# Patient Record
Sex: Female | Born: 1970 | Race: White | Hispanic: No | Marital: Married | Smoking: Never smoker
Health system: Southern US, Community
[De-identification: ages and names within clinical notes are randomized; demographics above are authoritative.]

## PROBLEM LIST (undated history)

## (undated) DIAGNOSIS — Z8489 Family history of other specified conditions: Secondary | ICD-10-CM

## (undated) DIAGNOSIS — Z8719 Personal history of other diseases of the digestive system: Secondary | ICD-10-CM

## (undated) DIAGNOSIS — M359 Systemic involvement of connective tissue, unspecified: Secondary | ICD-10-CM

## (undated) DIAGNOSIS — K5190 Ulcerative colitis, unspecified, without complications: Secondary | ICD-10-CM

## (undated) DIAGNOSIS — D649 Anemia, unspecified: Secondary | ICD-10-CM

## (undated) DIAGNOSIS — F419 Anxiety disorder, unspecified: Secondary | ICD-10-CM

## (undated) HISTORY — PX: OTHER SURGICAL HISTORY: SHX169

## (undated) HISTORY — PX: WISDOM TOOTH EXTRACTION: SHX21

## (undated) HISTORY — PX: COLONOSCOPY W/ POLYPECTOMY: SHX1380

---

## 2000-06-14 ENCOUNTER — Other Ambulatory Visit: Admission: RE | Admit: 2000-06-14 | Discharge: 2000-06-14 | Payer: Self-pay | Admitting: Obstetrics and Gynecology

## 2000-08-23 ENCOUNTER — Ambulatory Visit (HOSPITAL_COMMUNITY): Admission: RE | Admit: 2000-08-23 | Discharge: 2000-08-23 | Payer: Self-pay | Admitting: Obstetrics and Gynecology

## 2000-08-23 ENCOUNTER — Encounter: Payer: Self-pay | Admitting: Obstetrics and Gynecology

## 2000-09-07 ENCOUNTER — Inpatient Hospital Stay (HOSPITAL_COMMUNITY): Admission: AD | Admit: 2000-09-07 | Discharge: 2000-09-09 | Payer: Self-pay | Admitting: Obstetrics and Gynecology

## 2001-06-30 ENCOUNTER — Encounter: Payer: Self-pay | Admitting: Emergency Medicine

## 2001-06-30 ENCOUNTER — Emergency Department (HOSPITAL_COMMUNITY): Admission: EM | Admit: 2001-06-30 | Discharge: 2001-06-30 | Payer: Self-pay | Admitting: Emergency Medicine

## 2002-09-06 ENCOUNTER — Other Ambulatory Visit: Admission: RE | Admit: 2002-09-06 | Discharge: 2002-09-06 | Payer: Self-pay | Admitting: Obstetrics and Gynecology

## 2002-09-28 ENCOUNTER — Inpatient Hospital Stay (HOSPITAL_COMMUNITY): Admission: AD | Admit: 2002-09-28 | Discharge: 2002-09-28 | Payer: Self-pay | Admitting: Obstetrics and Gynecology

## 2002-11-14 ENCOUNTER — Inpatient Hospital Stay (HOSPITAL_COMMUNITY): Admission: AD | Admit: 2002-11-14 | Discharge: 2002-11-17 | Payer: Self-pay | Admitting: Obstetrics and Gynecology

## 2010-03-17 ENCOUNTER — Other Ambulatory Visit: Payer: Self-pay | Admitting: Family Medicine

## 2010-03-17 DIAGNOSIS — N6320 Unspecified lump in the left breast, unspecified quadrant: Secondary | ICD-10-CM

## 2010-03-19 ENCOUNTER — Ambulatory Visit
Admission: RE | Admit: 2010-03-19 | Discharge: 2010-03-19 | Disposition: A | Discharge status: Home / Self Care | Payer: Medicare HMO | Source: Ambulatory Visit | Attending: Family Medicine | Admitting: Family Medicine

## 2010-03-19 DIAGNOSIS — N6320 Unspecified lump in the left breast, unspecified quadrant: Secondary | ICD-10-CM

## 2011-09-22 ENCOUNTER — Ambulatory Visit (INDEPENDENT_AMBULATORY_CARE_PROVIDER_SITE_OTHER): Payer: Self-pay | Admitting: Obstetrics and Gynecology

## 2011-09-22 ENCOUNTER — Encounter: Payer: Self-pay | Admitting: Obstetrics and Gynecology

## 2011-09-22 VITALS — BP 110/62 | Resp 14 | Wt 132.0 lb

## 2011-09-22 DIAGNOSIS — N898 Other specified noninflammatory disorders of vagina: Secondary | ICD-10-CM

## 2011-09-22 NOTE — Progress Notes (Signed)
Presents with c/o of LMP 8/25 "took out tampon on 8/31 and discharge looked shredded like paper and like only 1/2 of the tampon came out, feeling crampy and bloated for a few days, douched x 4" Denies fever, chills, odor, or vag irritation, Declined STD testing O abd soft, nt, EGBUS WNL cervix pink mod white discharge no odor, No CMT Wet prep neg clue, neg trich, neg hyphae A no foreign object P discussed s/s fever, pain, irritation, and odor to report, f/o annually prn, collaboration with Dr. Su Hilt at [facility]. Lavera Guise, CNM

## 2011-09-23 DIAGNOSIS — N898 Other specified noninflammatory disorders of vagina: Secondary | ICD-10-CM | POA: Insufficient documentation

## 2013-11-13 ENCOUNTER — Encounter: Payer: Self-pay | Admitting: Obstetrics and Gynecology

## 2014-11-15 ENCOUNTER — Other Ambulatory Visit: Payer: Self-pay

## 2014-11-19 ENCOUNTER — Other Ambulatory Visit: Payer: Self-pay

## 2014-11-19 DIAGNOSIS — Z1231 Encounter for screening mammogram for malignant neoplasm of breast: Secondary | ICD-10-CM

## 2014-12-10 ENCOUNTER — Ambulatory Visit: Payer: Medicare HMO

## 2014-12-19 ENCOUNTER — Ambulatory Visit: Payer: Medicare HMO

## 2018-03-01 DIAGNOSIS — F419 Anxiety disorder, unspecified: Secondary | ICD-10-CM | POA: Insufficient documentation

## 2018-11-30 ENCOUNTER — Other Ambulatory Visit: Payer: Self-pay

## 2018-11-30 DIAGNOSIS — Z20822 Contact with and (suspected) exposure to covid-19: Secondary | ICD-10-CM

## 2018-12-02 LAB — NOVEL CORONAVIRUS, NAA: SARS-CoV-2, NAA: NOT DETECTED

## 2019-01-09 ENCOUNTER — Ambulatory Visit: Payer: HRSA Program | Attending: Internal Medicine

## 2019-01-09 DIAGNOSIS — Z20828 Contact with and (suspected) exposure to other viral communicable diseases: Secondary | ICD-10-CM | POA: Insufficient documentation

## 2019-01-09 DIAGNOSIS — Z20822 Contact with and (suspected) exposure to covid-19: Secondary | ICD-10-CM

## 2019-01-11 LAB — NOVEL CORONAVIRUS, NAA: SARS-CoV-2, NAA: NOT DETECTED

## 2019-01-30 ENCOUNTER — Encounter: Payer: Self-pay | Admitting: Family Medicine

## 2019-01-30 ENCOUNTER — Other Ambulatory Visit: Payer: Self-pay

## 2019-01-30 ENCOUNTER — Ambulatory Visit (INDEPENDENT_AMBULATORY_CARE_PROVIDER_SITE_OTHER): Payer: Self-pay | Admitting: Family Medicine

## 2019-01-30 DIAGNOSIS — W5503XA Scratched by cat, initial encounter: Secondary | ICD-10-CM

## 2019-01-30 DIAGNOSIS — S50811A Abrasion of right forearm, initial encounter: Secondary | ICD-10-CM

## 2019-01-30 MED ORDER — SULFAMETHOXAZOLE-TRIMETHOPRIM 800-160 MG PO TABS: 1.0000 | ORAL_TABLET | Freq: Two times a day (BID) | ORAL | 0 refills | Status: DC

## 2019-01-30 MED ORDER — AZITHROMYCIN 250 MG PO TABS: ORAL_TABLET | ORAL | 0 refills | Status: DC

## 2019-01-30 MED ORDER — TRAMADOL HCL 50 MG PO TABS: 50.0000 mg | ORAL_TABLET | Freq: Four times a day (QID) | ORAL | 0 refills | Status: DC | PRN

## 2019-01-30 NOTE — Progress Notes (Signed)
   Office Visit Note   Patient: Carly Armstrong           Date of Birth: 05-17-1970           MRN: [id number] Visit Date: 01/30/2019 Requested by: No referring provider defined for this encounter. PCP: System, Pcp Not In  Subjective: Chief Complaint  Patient presents with  . Right Forearm - Pain    DOI 01/28/19 Cat scratch/bite to forearm (own cat) - breaking up fight between her cat and her sister's dog. Redness/swelling in the forearm  - difficulty moving fingers. Right-hand dominant.    HPI: She is here with right forearm pain.  She is right-hand dominant.  2 days ago she was breaking up a fight between a cat and a dog, she stuck her arm between them and the cat scratched her in several spots.  Yesterday her arm was very swollen and painful, she had some amoxicillin at home and took a dose.  This morning it was even more swollen so she took some Cipro.  This afternoon it is slightly better but still very painful and swollen.  It hurts especially to move her third finger.  Denies any fevers or chills.  She has not noticed any swollen lymph nodes.  She is otherwise in good health.               ROS:   All other systems were reviewed and are negative.  Objective: Vital Signs: There were no vitals taken for this visit.  Physical Exam:  General:  Alert and oriented, in no acute distress. Pulm:  Breathing unlabored. Psy:  Normal mood, congruent affect. Skin: There are several puncture marks on her forearm but no active bleeding.  There is erythema and swelling on the dorsum of her forearm.  A photo was taken and placed in her chart.  She has very limited extension of her second, third, and fourth fingers due to pain in the mid forearm.  No palpable lymphadenopathy at the elbow.    Imaging: None today  Assessment & Plan: 1.  2 days status post right forearm cat scratch with probable bacterial infection - We discussed the risk of spread of infection and possible need for surgery. -We  will treat with Zithromax as well as Bactrim. - Will get her in with one of the surgeons if worsens.     Procedures: No procedures performed  No notes on file     PMFS History: Patient Active Problem List   Diagnosis Date Noted  . Anxiety 03/01/2018   History reviewed. No pertinent past medical history.  History reviewed. No pertinent family history.  History reviewed. No pertinent surgical history. Social History   Occupational History  . Not on file  Tobacco Use  . Smoking status: Not on file  Substance and Sexual Activity  . Alcohol use: Not on file  . Drug use: Not on file  . Sexual activity: Not on file

## 2019-02-10 ENCOUNTER — Encounter: Payer: Self-pay | Admitting: Family Medicine

## 2019-02-10 ENCOUNTER — Ambulatory Visit (INDEPENDENT_AMBULATORY_CARE_PROVIDER_SITE_OTHER): Payer: Self-pay | Admitting: Family Medicine

## 2019-02-10 ENCOUNTER — Ambulatory Visit: Payer: Self-pay

## 2019-02-10 ENCOUNTER — Other Ambulatory Visit: Payer: Self-pay

## 2019-02-10 DIAGNOSIS — W5503XA Scratched by cat, initial encounter: Secondary | ICD-10-CM

## 2019-02-10 DIAGNOSIS — S50811A Abrasion of right forearm, initial encounter: Secondary | ICD-10-CM

## 2019-02-10 MED ORDER — SULFAMETHOXAZOLE-TRIMETHOPRIM 800-160 MG PO TABS: 1.0000 | ORAL_TABLET | Freq: Two times a day (BID) | ORAL | 0 refills | Status: DC

## 2019-02-10 MED ORDER — AZITHROMYCIN 250 MG PO TABS: ORAL_TABLET | ORAL | 0 refills | Status: DC

## 2019-02-10 NOTE — Progress Notes (Signed)
ultra

## 2019-02-10 NOTE — Progress Notes (Signed)
   Office Visit Note   Patient: Carly Armstrong           Date of Birth: 1970/06/02           MRN: [id number] Visit Date: 02/10/2019 Requested by: No referring provider defined for this encounter. PCP: System, Pcp Not In  Subjective: Chief Complaint  Patient presents with  . Right Forearm - Pain, Injury    Better ROM. Swelling and redness has decreased.    HPI: She is here for follow-up right forearm cat bite.  She finished Zithromax and Bactrim yesterday.  Each day she feels like she is making progress but she still has some pain when she pushes off with her right hand or when she forcefully flexes her hand.  She feels like the tendon involving her third finger is irritated.  No fever or chills.              ROS:   All other systems were reviewed and are negative.  Objective: Vital Signs: There were no vitals taken for this visit.  Physical Exam:  General:  Alert and oriented, in no acute distress. Pulm:  Breathing unlabored. Psy:  Normal mood, congruent affect. Skin: No erythema or warmth. Right forearm: On the dorsum mid forearm, there is a focal area of tenderness and soft tissue swelling swelling.  She has pain with active movement of her third finger.  She has slightly decreased flexion of her wrist because of the forearm pain.  Imaging: Limited diagnostic ultrasound right forearm: There is fluid in the tendon sheath around the third finger extensor tendon at the mid forearm as well as hyperemia on power Doppler imaging.  Tendon itself does not look significantly swollen and I do not see any tears in the tendon.  Assessment & Plan: 1.  Clinically improving right forearm cat bite -We will resume Zithromax and Bactrim.  She will try therapeutic ultrasound for physical therapy.  If symptoms worsen, she will call me and I will order MRI scan of her forearm.  Otherwise she will follow-up as needed.     Procedures: No procedures performed  No notes on file     PMFS  History: Patient Active Problem List   Diagnosis Date Noted  . Anxiety 03/01/2018   History reviewed. No pertinent past medical history.  History reviewed. No pertinent family history.  History reviewed. No pertinent surgical history. Social History   Occupational History  . Not on file  Tobacco Use  . Smoking status: Not on file  Substance and Sexual Activity  . Alcohol use: Not on file  . Drug use: Not on file  . Sexual activity: Not on file

## 2019-03-20 ENCOUNTER — Ambulatory Visit: Payer: Self-pay | Attending: Internal Medicine

## 2019-03-20 DIAGNOSIS — Z20822 Contact with and (suspected) exposure to covid-19: Secondary | ICD-10-CM

## 2019-03-21 ENCOUNTER — Other Ambulatory Visit: Payer: Self-pay

## 2019-03-21 LAB — NOVEL CORONAVIRUS, NAA: SARS-CoV-2, NAA: NOT DETECTED

## 2019-05-03 ENCOUNTER — Ambulatory Visit: Payer: 59 | Attending: Internal Medicine

## 2019-05-03 DIAGNOSIS — Z20822 Contact with and (suspected) exposure to covid-19: Secondary | ICD-10-CM | POA: Insufficient documentation

## 2019-05-04 LAB — NOVEL CORONAVIRUS, NAA: SARS-CoV-2, NAA: NOT DETECTED

## 2019-05-04 LAB — SARS-COV-2, NAA 2 DAY TAT

## 2019-06-05 ENCOUNTER — Ambulatory Visit (INDEPENDENT_AMBULATORY_CARE_PROVIDER_SITE_OTHER): Payer: 59 | Admitting: Physician Assistant

## 2019-06-05 ENCOUNTER — Encounter: Payer: Self-pay | Admitting: Physician Assistant

## 2019-06-05 ENCOUNTER — Other Ambulatory Visit: Payer: Self-pay

## 2019-06-05 DIAGNOSIS — L820 Inflamed seborrheic keratosis: Secondary | ICD-10-CM | POA: Diagnosis not present

## 2019-06-05 DIAGNOSIS — Z1283 Encounter for screening for malignant neoplasm of skin: Secondary | ICD-10-CM

## 2019-06-05 DIAGNOSIS — L814 Other melanin hyperpigmentation: Secondary | ICD-10-CM

## 2019-06-05 NOTE — Progress Notes (Signed)
   Follow up Visit  Subjective  Carly Armstrong is a 49 y.o. female who presents for the following: Annual Exam (left upper arm- "dry spot" & husband said weird spot on back).  Objective  Well appearing patient in no apparent distress; mood and affect are within normal limits.  A full examination was performed including scalp, head, eyes, ears, nose, lips, neck, chest, axillae, abdomen, back, buttocks, bilateral upper extremities, bilateral lower extremities, hands, feet, fingers, toes, fingernails, and toenails. All findings within normal limits unless otherwise noted below. No suspicious moles noted on back.   Objective  Left Upper Arm - Anterior: Erythematous stuck-on, waxy papule or plaque.   Objective  Left Nasal Sidewall: Scattered tan macules.   Images    Assessment & Plan  Inflamed seborrheic keratosis Left Upper Arm - Anterior  Destruction of lesion - Left Upper Arm - Anterior Complexity: simple   Destruction method: cryotherapy   Informed consent: discussed and consent obtained   Timeout:  patient name, date of birth, surgical site, and procedure verified Lesion destroyed using liquid nitrogen: Yes   Outcome: patient tolerated procedure well with no complications    Lentigines Left Nasal Sidewall  We have taken a photo of the left side of her nose just to be able to compare these lentigenes in the future. Pt. States they have been stable. No growth or color change.   Skin exam for malignant neoplasm Chest - Medial [facility])  Total body skin exam.

## 2019-06-07 ENCOUNTER — Ambulatory Visit: Payer: 59 | Attending: Internal Medicine

## 2019-06-07 ENCOUNTER — Other Ambulatory Visit: Payer: Self-pay

## 2019-06-07 DIAGNOSIS — Z20822 Contact with and (suspected) exposure to covid-19: Secondary | ICD-10-CM

## 2019-06-08 LAB — SARS-COV-2, NAA 2 DAY TAT

## 2019-06-08 LAB — NOVEL CORONAVIRUS, NAA: SARS-CoV-2, NAA: NOT DETECTED

## 2019-06-20 ENCOUNTER — Other Ambulatory Visit: Payer: Self-pay | Admitting: Family Medicine

## 2019-06-20 DIAGNOSIS — Z1231 Encounter for screening mammogram for malignant neoplasm of breast: Secondary | ICD-10-CM

## 2019-07-18 ENCOUNTER — Other Ambulatory Visit: Payer: Self-pay

## 2019-07-18 ENCOUNTER — Ambulatory Visit
Admission: RE | Admit: 2019-07-18 | Discharge: 2019-07-18 | Disposition: A | Discharge status: Home / Self Care | Payer: 59 | Source: Ambulatory Visit | Attending: Family Medicine | Admitting: Family Medicine

## 2019-07-18 DIAGNOSIS — Z1231 Encounter for screening mammogram for malignant neoplasm of breast: Secondary | ICD-10-CM

## 2020-06-03 ENCOUNTER — Other Ambulatory Visit: Payer: Self-pay

## 2020-06-03 ENCOUNTER — Ambulatory Visit (INDEPENDENT_AMBULATORY_CARE_PROVIDER_SITE_OTHER): Payer: 59 | Admitting: Dermatology

## 2020-06-03 ENCOUNTER — Ambulatory Visit: Payer: 59 | Admitting: Physician Assistant

## 2020-06-03 DIAGNOSIS — Q825 Congenital non-neoplastic nevus: Secondary | ICD-10-CM

## 2020-06-03 DIAGNOSIS — Z1283 Encounter for screening for malignant neoplasm of skin: Secondary | ICD-10-CM

## 2020-06-03 DIAGNOSIS — L821 Other seborrheic keratosis: Secondary | ICD-10-CM

## 2020-06-03 DIAGNOSIS — D1801 Hemangioma of skin and subcutaneous tissue: Secondary | ICD-10-CM

## 2020-06-03 DIAGNOSIS — L851 Acquired keratosis [keratoderma] palmaris et plantaris: Secondary | ICD-10-CM | POA: Diagnosis not present

## 2020-06-03 DIAGNOSIS — L570 Actinic keratosis: Secondary | ICD-10-CM

## 2020-06-17 ENCOUNTER — Encounter: Payer: Self-pay | Admitting: Dermatology

## 2020-06-17 NOTE — Progress Notes (Signed)
   Follow-Up Visit   Subjective  Carly Armstrong is a 50 y.o. female who presents for the following: Annual Exam (Yearly skin exams. No concerns. ).  General skin examination Location: Several spots that she would like checked. Duration:  Quality:  Associated Signs/Symptoms: Modifying Factors:  Severity:  Timing: Context:   Objective  Well appearing patient in no apparent distress; mood and affect are within normal limits. Objective  Mid Back: Full body skin exam.  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 All findings within normal limits unless otherwise noted below.  Areas beneath undergarments not fully examined   Assessment & Plan    Screening for malignant neoplasm of skin Mid Back  Yearly skin exam.  Encouraged to self examine twice annually.  Continued ultraviolet protection.  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  The documentation on 06/17/20 for the exam, diagnosis, procedures, and orders are all accurate and complete.

## 2020-07-10 ENCOUNTER — Emergency Department (HOSPITAL_COMMUNITY): Payer: 59

## 2020-07-10 ENCOUNTER — Other Ambulatory Visit: Payer: Self-pay

## 2020-07-10 ENCOUNTER — Encounter (HOSPITAL_COMMUNITY): Payer: Self-pay

## 2020-07-10 ENCOUNTER — Emergency Department (HOSPITAL_COMMUNITY)
Admission: EM | Admit: 2020-07-10 | Discharge: 2020-07-10 | Disposition: A | Discharge status: Home / Self Care | Payer: 59 | Attending: Emergency Medicine | Admitting: Emergency Medicine

## 2020-07-10 DIAGNOSIS — R1013 Epigastric pain: Secondary | ICD-10-CM | POA: Diagnosis not present

## 2020-07-10 DIAGNOSIS — R079 Chest pain, unspecified: Secondary | ICD-10-CM

## 2020-07-10 DIAGNOSIS — R0600 Dyspnea, unspecified: Secondary | ICD-10-CM | POA: Diagnosis not present

## 2020-07-10 DIAGNOSIS — Z79899 Other long term (current) drug therapy: Secondary | ICD-10-CM | POA: Diagnosis not present

## 2020-07-10 DIAGNOSIS — R110 Nausea: Secondary | ICD-10-CM | POA: Diagnosis not present

## 2020-07-10 LAB — HEPATIC FUNCTION PANEL
ALT: 13 U/L (ref 0–44)
AST: 18 U/L (ref 15–41)
Albumin: 4.1 g/dL (ref 3.5–5.0)
Alkaline Phosphatase: 80 U/L (ref 38–126)
Bilirubin, Direct: 0.2 mg/dL (ref 0.0–0.2)
Indirect Bilirubin: 1.5 mg/dL — ABNORMAL HIGH (ref 0.3–0.9)
Total Bilirubin: 1.7 mg/dL — ABNORMAL HIGH (ref 0.3–1.2)
Total Protein: 6.9 g/dL (ref 6.5–8.1)

## 2020-07-10 LAB — RAPID URINE DRUG SCREEN, HOSP PERFORMED
Amphetamines: NOT DETECTED
Barbiturates: NOT DETECTED
Benzodiazepines: NOT DETECTED
Cocaine: NOT DETECTED
Opiates: NOT DETECTED
Tetrahydrocannabinol: POSITIVE — AB

## 2020-07-10 LAB — BASIC METABOLIC PANEL
Anion gap: 8 (ref 5–15)
BUN: 10 mg/dL (ref 6–20)
CO2: 24 mmol/L (ref 22–32)
Calcium: 9.7 mg/dL (ref 8.9–10.3)
Chloride: 106 mmol/L (ref 98–111)
Creatinine, Ser: 0.84 mg/dL (ref 0.44–1.00)
GFR, Estimated: >60 mL/min (ref >60)
Glucose, Bld: 113 mg/dL — ABNORMAL HIGH (ref 70–99)
Potassium: 3.8 mmol/L (ref 3.5–5.1)
Sodium: 138 mmol/L (ref 135–145)

## 2020-07-10 LAB — I-STAT BETA HCG BLOOD, ED (MC, WL, AP ONLY): I-stat hCG, quantitative: <5 m[IU]/mL (ref <5)

## 2020-07-10 LAB — URINALYSIS, ROUTINE W REFLEX MICROSCOPIC
Bilirubin Urine: NEGATIVE
Glucose, UA: NEGATIVE mg/dL
Hgb urine dipstick: NEGATIVE
Ketones, ur: NEGATIVE mg/dL
Leukocytes,Ua: NEGATIVE
Nitrite: NEGATIVE
Protein, ur: NEGATIVE mg/dL
Specific Gravity, Urine: 1.005 (ref 1.005–1.030)
pH: 8 (ref 5.0–8.0)

## 2020-07-10 LAB — TSH: TSH: 1.582 u[IU]/mL (ref 0.350–4.500)

## 2020-07-10 LAB — CBC
HCT: 43 % (ref 36.0–46.0)
Hemoglobin: 14.1 g/dL (ref 12.0–15.0)
MCH: 30.6 pg (ref 26.0–34.0)
MCHC: 32.8 g/dL (ref 30.0–36.0)
MCV: 93.3 fL (ref 80.0–100.0)
Platelets: 282 10*3/uL (ref 150–400)
RBC: 4.61 MIL/uL (ref 3.87–5.11)
RDW: 12.8 % (ref 11.5–15.5)
WBC: 9.2 10*3/uL (ref 4.0–10.5)
nRBC: 0 % (ref 0.0–0.2)

## 2020-07-10 LAB — LIPASE, BLOOD: Lipase: 45 U/L (ref 11–51)

## 2020-07-10 LAB — PROTIME-INR
INR: 1 (ref 0.8–1.2)
Prothrombin Time: 13.6 seconds (ref 11.4–15.2)

## 2020-07-10 LAB — TROPONIN I (HIGH SENSITIVITY)
Troponin I (High Sensitivity): 4 ng/L (ref <18)
Troponin I (High Sensitivity): 4 ng/L (ref <18)

## 2020-07-10 MED ORDER — KETOROLAC TROMETHAMINE 30 MG/ML IJ SOLN
30.0000 mg | Freq: Once | INTRAMUSCULAR | Status: AC
  Administered 2020-07-10: 30 mg via INTRAVENOUS
  Filled 2020-07-10: qty 1

## 2020-07-10 MED ORDER — SODIUM CHLORIDE 0.9 % IV BOLUS
500.0000 mL | Freq: Once | INTRAVENOUS | Status: AC
  Administered 2020-07-10: 500 mL via INTRAVENOUS

## 2020-07-10 MED ORDER — PANTOPRAZOLE SODIUM 20 MG PO TBEC: 20.0000 mg | DELAYED_RELEASE_TABLET | Freq: Every day | ORAL | 0 refills | Status: DC

## 2020-07-10 MED ORDER — PANTOPRAZOLE SODIUM 40 MG IV SOLR
40.0000 mg | Freq: Once | INTRAVENOUS | Status: AC
  Administered 2020-07-10: 40 mg via INTRAVENOUS
  Filled 2020-07-10: qty 40

## 2020-07-10 MED ORDER — IOHEXOL 350 MG/ML SOLN
50.0000 mL | Freq: Once | INTRAVENOUS | Status: AC | PRN
  Administered 2020-07-10: 50 mL via INTRAVENOUS

## 2020-07-10 NOTE — Discharge Instructions (Addendum)
1.  You had 2 sets of normal troponins.  No sign that you had a heart attack.  You appear to be low risk for coronary artery disease.  However, it is very important you follow-up with your family physician for continued monitoring and further diagnostic testing if indicated. 2.  Reflux and esophageal dysfunction are common causes for chest pain.  At this time, I recommend you take 2 weeks of Protonix daily as prescribed.  Follow dietary instructions for low-fat\no fat diet and other reflux oriented management.  Sometimes people develop esophageal spasm.  This can be a very sudden and painful event.  It is treated in a similar fashion with reflux medicatons to decrease acid secretion. 3.  Gallbladder attacks can also cause severe chest pain.  If you have persisting or worsening pain, you may also need an ultrasound of your gallbladder.  The gallbladder is triggered by fatty foods.  Avoid all fat containing foods at this time. 4.  Counterintuitively, marijuana can cause symptoms of upper abdominal pain, nausea and anxiety in long term marijuana consumers.  May wish to discuss this with your doctor and research this phenomenon so you are aware of potential symptoms you are experiencing.

## 2020-07-10 NOTE — ED Provider Notes (Signed)
[facility] EMERGENCY DEPARTMENT Provider Note   CSN: [id number] Arrival date & time: 07/10/20  5053     History Chief Complaint  Patient presents with   Chest Pain    Carly Armstrong is a 50 y.o. female.  HPI Reports that she had sudden onset of chest pain yesterday about 1 PM.  She was at work doing a consult with a client.  No strenuous activity.  She had a tight feeling in the center of her chest up to the base of her neck.  She reports it was very uncomfortable.  She reports it felt kind of hard to take a deep breath and but not specifically short of breath.  Patient reports she had eaten lunch about an hour earlier.  She had noodles and vegetables she had not had any pain with swallowing or eating.  She thought it might be anxiety and went home and tried some yoga.  She was able to eat dinner.  She had some rice noodles and cheese.  Still no difficulty swallowing and no nausea.  She continued to feel uncomfortable with taking a deep breath.  She reports symptoms were worse lying flat.  Reports this morning she did become nauseated and decided to seek evaluation.  She has not tried anything for pain.  Patient reports only medication is occasional, 1-2 times per month Xanax use for anxiety.  Patient does report typically once daily marijuana use.  Rare alcohol use.  Patient was recently on a road trip to [place] which was approximately an 11-hour drive to a wedding.  No lower extremity swelling or calf pain.    History reviewed. No pertinent past medical history.  Patient Active Problem List   Diagnosis Date Noted   Anxiety 03/01/2018    Past Surgical History:  Procedure Laterality Date   anxiety        OB History     Gravida  8   Para  3   Term      Preterm      AB      Living         SAB      IAB      Ectopic      Multiple      Live Births              History reviewed. No pertinent family history.  Social History   Tobacco  Use   Smoking status: Never   Smokeless tobacco: Never  Substance Use Topics   Alcohol use: Not Currently   Drug use: Never    Home Medications Prior to Admission medications   Medication Sig Start Date End Date Taking? Authorizing Provider  pantoprazole (PROTONIX) 20 MG tablet Take 1 tablet (20 mg total) by mouth daily. 07/10/20  Yes Travante Knee, Jeannie Done, MD  ALPRAZolam Duanne Moron) 0.25 MG tablet Take 0.25 mg by mouth 2 (two) times daily as needed. 05/15/20   [provider]    Allergies    Acetaminophen, Asa [aspirin], and Codeine  Review of Systems   Review of Systems 10 Systems reviewed and negative except as per HPI Physical Exam Updated Vital Signs BP (!) 101/51   Pulse 78   Temp 98.2 F (36.8 C) (Oral)   Resp 20   LMP 09/06/2011   SpO2 98%   Physical Exam Constitutional:      Appearance: Normal appearance.     Comments: Well-nourished well-developed.  Nontoxic no acute distress.  No respiratory  distress.  HENT:     Mouth/Throat:     Pharynx: Oropharynx is clear.  Eyes:     Extraocular Movements: Extraocular movements intact.  Cardiovascular:     Rate and Rhythm: Normal rate and regular rhythm.  Pulmonary:     Effort: Pulmonary effort is normal.     Breath sounds: Normal breath sounds.     Comments: Very minimally reproducible chest discomfort to pressure over the sternum Abdominal:     Comments: Mild epigastric discomfort to palpation.  No guarding.  Lower abdomen nontender  Musculoskeletal:        General: No swelling or tenderness. Normal range of motion.     Right lower leg: No edema.     Left lower leg: No edema.  Skin:    General: Skin is warm and dry.  Neurological:     General: No focal deficit present.     Mental Status: She is alert and oriented to person, place, and time.     Motor: No weakness.     Coordination: Coordination normal.  Psychiatric:        Mood and Affect: Mood normal.    ED Results / Procedures / Treatments   Labs (all  labs ordered are listed, but only abnormal results are displayed) Labs Reviewed  BASIC METABOLIC PANEL - Abnormal; Notable for the following components:      Result Value   Glucose, Bld 113 (*)    All other components within normal limits  HEPATIC FUNCTION PANEL - Abnormal; Notable for the following components:   Total Bilirubin 1.7 (*)    Indirect Bilirubin 1.5 (*)    All other components within normal limits  URINALYSIS, ROUTINE W REFLEX MICROSCOPIC - Abnormal; Notable for the following components:   Color, Urine STRAW (*)    All other components within normal limits  RAPID URINE DRUG SCREEN, HOSP PERFORMED - Abnormal; Notable for the following components:   Tetrahydrocannabinol POSITIVE (*)    All other components within normal limits  CBC  LIPASE, BLOOD  PROTIME-INR  TSH  I-STAT BETA HCG BLOOD, ED ([facility], [facility], [facility] ONLY)  TROPONIN I (HIGH SENSITIVITY)  TROPONIN I (HIGH SENSITIVITY)    EKG EKG Interpretation  Date/Time:  Wednesday July 10 2020 07:31:36 EDT Ventricular Rate:  95 PR Interval:  146 QRS Duration: 86 QT Interval:  350 QTC Calculation: 439 R Axis:   81 Text Interpretation: Normal sinus rhythm Minimal voltage criteria for LVH, may be normal variant ( Sokolow-Lyon ) Borderline ECG agree, no old comparison Confirmed by Charlesetta Shanks [phone]) on 07/10/2020 7:38:23 AM  Radiology CT Angio Chest PE W/Cm &/Or Wo Cm  Result Date: 07/10/2020 CLINICAL DATA:  Acute chest pain and shortness of breath EXAM: CT ANGIOGRAPHY CHEST WITH CONTRAST TECHNIQUE: Multidetector CT imaging of the chest was performed using the standard protocol during bolus administration of intravenous contrast. Multiplanar CT image reconstructions and MIPs were obtained to evaluate the vascular anatomy. CONTRAST:  38mL OMNIPAQUE IOHEXOL 350 MG/ML SOLN COMPARISON:  07/10/2020 FINDINGS: Cardiovascular: Pulmonary arteries are well visualized, normal in caliber and appear patent. No significant filling defect or  pulmonary embolus by CTA. Intact thoracic aorta without aneurysm or dissection. Patent 3 vessel arch anatomy. No mediastinal hemorrhage or hematoma. Normal heart size. No pericardial effusion. Central venous structures appear patent. No veno-occlusive process. Mediastinum/Nodes: No enlarged mediastinal, hilar, or axillary lymph nodes. Thyroid gland, trachea, and esophagus demonstrate no significant findings. Lungs/Pleura: Lungs are clear. No pleural effusion or pneumothorax. Upper Abdomen: No  acute abnormality. Musculoskeletal: No chest wall abnormality. No acute or significant osseous findings. Review of the MIP images confirms the above findings. IMPRESSION: Negative for significant acute pulmonary embolus by CTA. No other acute intrathoracic finding. Electronically Signed   By: Jerilynn Mages.  Shick M.D.   On: 07/10/2020 11:32   DG Chest Portable 1 View  Result Date: 07/10/2020 CLINICAL DATA:  Chest pain. EXAM: PORTABLE CHEST 1 VIEW COMPARISON:  None. FINDINGS: Single view of the chest demonstrates clear lungs. Negative for a pneumothorax. Trachea is midline. Heart and mediastinum are within normal limits. Bone structures are unremarkable. IMPRESSION: No active disease. Electronically Signed   By: Markus Daft M.D.   On: 07/10/2020 07:57    Procedures Procedures   Medications Ordered in ED Medications  pantoprazole (PROTONIX) injection 40 mg (40 mg Intravenous Given 07/10/20 0842)  ketorolac (TORADOL) 30 MG/ML injection 30 mg (30 mg Intravenous Given 07/10/20 0844)  sodium chloride 0.9 % bolus 500 mL (0 mLs Intravenous Stopped 07/10/20 1011)  iohexol (OMNIPAQUE) 350 MG/ML injection 50 mL (50 mLs Intravenous Contrast Given 07/10/20 1113)    ED Course  I have reviewed the triage vital signs and the nursing notes.  Pertinent labs & imaging results that were available during my care of the patient were reviewed by me and considered in my medical decision making (see chart for details).    MDM  Rules/Calculators/A&P                          Patient presents as outlined with almost 24 hours of chest pain.  Troponin x2 is negative.  Pain is low risk for coronary artery disease.  At this time, I have low suspicion for cardiac ischemic etiology. patient had a recent long car ride.  PE study obtained and negative for PE.  Also negative for other abnormal chest or upper abdomen findings.  Consideration given to reflux or esophageal spasm.  Will initiate Protonix daily for 2 weeks with PCP follow-up for further evaluation.  Consideration for possible biliary colic although at this time patient does not have reproducible right upper quadrant pain and LFTs are normal.  recommendations made for low-fat diet and follow-up.  Patient is a nearly daily user of cannabis.  Patient is not having aggressive vomiting however consideration given to mild symptoms of cannabinoid induced pain and nausea.  Educational instruction provided.  At this time, patient is in stable condition with stable vital signs and improved symptoms with conservative treatment.  Follow-up plan and return precautions reviewed. Final Clinical Impression(s) / ED Diagnoses Final diagnoses:  Nonspecific chest pain  Dyspnea, unspecified type    Rx / DC Orders ED Discharge Orders          Ordered    pantoprazole (PROTONIX) 20 MG tablet  Daily        07/10/20 1151             Charlesetta Shanks, MD 07/10/20 1205

## 2020-07-10 NOTE — ED Notes (Signed)
Back from CT

## 2020-07-10 NOTE — ED Triage Notes (Signed)
Pt presents with crushing mid sternum chest pain radiating up into her Left side of her neck. Pt reports she was unable to lay flat last night. Onset cp was 1pm yesterday

## 2020-11-01 ENCOUNTER — Other Ambulatory Visit: Payer: Self-pay

## 2020-11-01 ENCOUNTER — Ambulatory Visit (INDEPENDENT_AMBULATORY_CARE_PROVIDER_SITE_OTHER): Payer: 59 | Admitting: Podiatry

## 2020-11-01 DIAGNOSIS — L603 Nail dystrophy: Secondary | ICD-10-CM | POA: Diagnosis not present

## 2020-11-04 ENCOUNTER — Other Ambulatory Visit: Payer: Self-pay | Admitting: Podiatry

## 2020-11-04 DIAGNOSIS — K5190 Ulcerative colitis, unspecified, without complications: Secondary | ICD-10-CM | POA: Insufficient documentation

## 2020-11-07 ENCOUNTER — Encounter: Payer: Self-pay | Admitting: Podiatry

## 2020-11-07 NOTE — Progress Notes (Signed)
  Subjective:  Patient ID: Carly Armstrong, female    DOB: [birth year],  MRN: [id number]  Chief Complaint  Patient presents with   Nail Problem    Left foot 2nd nail will not grow     50 y.o. female presents with the above complaint.  Patient presents with left second toenail pain and discoloration and thickening.  She states that it would not grow in normal limit.  He tends to grow a pointed upward.  She denies any treatment options she would like to discuss other treatment options.  She has not seen anyone else prior to seeing me.  Very mild or minimal pain.  She has been able to debride down herself.   Review of Systems: Negative except as noted in the HPI. Denies N/V/F/Ch.  No past medical history on file.  Current Outpatient Medications:    ALPRAZolam (XANAX) 0.25 MG tablet, Take 0.25 mg by mouth 2 (two) times daily as needed., Disp: , Rfl:    pantoprazole (PROTONIX) 20 MG tablet, Take 1 tablet (20 mg total) by mouth daily., Disp: 30 tablet, Rfl: 0  Social History   Tobacco Use  Smoking Status Never  Smokeless Tobacco Never    Allergies  Allergen Reactions   Acetaminophen    Asa [Aspirin]    Codeine    Objective:  There were no vitals filed for this visit. There is no height or weight on file to calculate BMI. Constitutional Well developed. Well nourished.  Vascular Dorsalis pedis pulses palpable bilaterally. Posterior tibial pulses palpable bilaterally. Capillary refill normal to all digits.  No cyanosis or clubbing noted. Pedal hair growth normal.  Neurologic Normal speech. Oriented to person, place, and time. Epicritic sensation to light touch grossly present bilaterally.  Dermatologic Pain on palpation of the entire/total nail on 1st digit of the left No other open wounds. No skin lesions.  Orthopedic: Normal joint ROM without pain or crepitus bilaterally. No visible deformities. No bony tenderness.   Radiographs: None Assessment:   1. Nail dystrophy     Plan:  Patient was evaluated and treated and all questions answered.  Nail contusion/dystrophy second, left -Patient elects to proceed with minor surgery to remove entire toenail today. Consent reviewed and signed by patient. -Entire/total nail excised. See procedure note. -Educated on post-procedure care including soaking. Written instructions provided and reviewed. -Patient to follow up in 2 weeks for nail check if needed.  Procedure: Excision of entire/total nail  Location: Left 2nd toe digit Anesthesia: Lidocaine 1% plain; 1.5 mL and Marcaine 0.5% plain; 1.5 mL, digital block. Skin Prep: Betadine. Dressing: Silvadene; telfa; dry, sterile, compression dressing. Technique: Following skin prep, the toe was exsanguinated and a tourniquet was secured at the base of the toe. The affected nail border was freed and excised. The tourniquet was then removed and sterile dressing applied. Disposition: Patient tolerated procedure well. Patient to return in 2 weeks for follow-up.   No follow-ups on file.

## 2021-01-05 ENCOUNTER — Emergency Department (HOSPITAL_COMMUNITY)
Admission: EM | Admit: 2021-01-05 | Discharge: 2021-01-06 | Disposition: A | Discharge status: Home / Self Care | Payer: 59 | Attending: Student | Admitting: Student

## 2021-01-05 ENCOUNTER — Other Ambulatory Visit: Payer: Self-pay

## 2021-01-05 DIAGNOSIS — X509XXA Other and unspecified overexertion or strenuous movements or postures, initial encounter: Secondary | ICD-10-CM | POA: Insufficient documentation

## 2021-01-05 DIAGNOSIS — M25571 Pain in right ankle and joints of right foot: Secondary | ICD-10-CM | POA: Diagnosis not present

## 2021-01-05 DIAGNOSIS — S82391A Other fracture of lower end of right tibia, initial encounter for closed fracture: Secondary | ICD-10-CM | POA: Insufficient documentation

## 2021-01-05 DIAGNOSIS — S82301A Unspecified fracture of lower end of right tibia, initial encounter for closed fracture: Secondary | ICD-10-CM

## 2021-01-05 DIAGNOSIS — S8991XA Unspecified injury of right lower leg, initial encounter: Secondary | ICD-10-CM | POA: Diagnosis present

## 2021-01-05 MED ORDER — IBUPROFEN 800 MG PO TABS
800.0000 mg | ORAL_TABLET | Freq: Once | ORAL | Status: AC
  Administered 2021-01-05: 800 mg via ORAL
  Filled 2021-01-05: qty 1

## 2021-01-05 NOTE — ED Triage Notes (Signed)
Pt states she thinks she broke her right leg while she slipped on ice tonight.

## 2021-01-05 NOTE — ED Provider Notes (Signed)
Emergency Medicine Provider Triage Evaluation Note  Carly Armstrong , a 50 y.o. female  was evaluated in triage.  Pt complains of right ankle pain after a fall that occurred just prior to arrival.  Patient states that she slipped on some ice and tripped.  She is unable to stand up due to pain.  She notes pain is severe in her ankle radiating up her shin down to her foot.  She denies numbness tingling.  She has no other complaints Review of Systems  Positive: Right ankle pain Negative: Numbness tingling  Physical Exam  BP (!) 111/50    Pulse 68    Temp 98.2 F (36.8 C) (Oral)    Ht 5\' 6"  (1.676 m)    Wt 62.1 kg    LMP 09/06/2011    SpO2 100%    BMI 22.11 kg/m  Gen:   Awake, patient in significant pain, sitting in wheelchair Resp:  Normal effort  MSK:   Moves extremities without difficulty;  Other:  I attempted to remove patient her sock, however even slightest touch trigger severe pain.  There is significant swelling of the right ankle up into the right shin.  2+ DP pulses.  sensation of the right foot is intact.  Remainder of exam is limited due to patient discomfort.  Without being able to administer narcotic medications here in triage, I feel complete examination can take place when she has remained with x-rays completed.  Medical Decision Making  Medically screening exam initiated at 11:36 PM.  Appropriate orders placed.  Carly Armstrong was informed that the remainder of the evaluation will be completed by another provider, this initial triage assessment does not replace that evaluation, and the importance of remaining in the ED until their evaluation is complete.  X-ray of tibia/fibula, ankle and foot ordered.   Tonye Pearson, PA-C 01/05/21 2340    Teressa Lower, MD 01/06/21 [phone]

## 2021-01-06 ENCOUNTER — Emergency Department (HOSPITAL_COMMUNITY): Payer: 59

## 2021-01-06 MED ORDER — OXYCODONE HCL 5 MG PO TABS: 5.0000 mg | ORAL_TABLET | Freq: Four times a day (QID) | ORAL | 0 refills | Status: DC | PRN

## 2021-01-06 MED ORDER — OXYCODONE HCL 5 MG PO TABS
10.0000 mg | ORAL_TABLET | Freq: Once | ORAL | Status: AC
  Administered 2021-01-06: 06:00:00 10 mg via ORAL
  Filled 2021-01-06: qty 2

## 2021-01-06 NOTE — Progress Notes (Signed)
Orthopedic Tech Progress Note Patient Details:  Carly Armstrong 1970/10/07 [id number] Asked MD if he wanted me to apply a LONG LEG instead due to where break was he said no a short leg would be just find. Added extra padding to splint Ortho Devices Type of Ortho Device: Cotton web roll, Crutches, Stirrup splint, Short leg splint Ortho Device/Splint Location: RLE Ortho Device/Splint Interventions: Ordered, Application, Adjustment      Janit Pagan 01/06/2021, 9:45 AM

## 2021-01-06 NOTE — ED Provider Notes (Signed)
[facility] EMERGENCY DEPARTMENT Provider Note   CSN: [id number] Arrival date & time: 01/05/21  2128     History Chief Complaint  Patient presents with   Leg Injury    Carly Armstrong is a 50 y.o. female.  Patient with slip and fall on icy surface onto her buttocks and back.  Did Not hit her head or lose consciousness.  Twisted her right leg and ankle.  Unable to bear weight.  Denies hitting head.  No neck or back pain.  No chest or abdominal pain.  Complains of severe pain to her right leg and right ankle.  No numbness or tingling.  No breaks in the skin.  Denies any other injury.  Denies any blood thinner use.  The history is provided by the patient.      No past medical history on file.  Patient Active Problem List   Diagnosis Date Noted   Anxiety 03/01/2018    Past Surgical History:  Procedure Laterality Date   anxiety        OB History     Gravida  8   Para  3   Term      Preterm      AB      Living         SAB      IAB      Ectopic      Multiple      Live Births              No family history on file.  Social History   Tobacco Use   Smoking status: Never   Smokeless tobacco: Never  Substance Use Topics   Alcohol use: Not Currently   Drug use: Never    Home Medications Prior to Admission medications   Medication Sig Start Date End Date Taking? Authorizing Provider  busPIRone (BUSPAR) 15 MG tablet Take 5 mg by mouth at bedtime. 09/17/20  Yes [provider]  Cholecalciferol (VITAMIN D3 SUPER STRENGTH) 50 MCG (2000 UT) TABS Take 4,000 Units by mouth daily.   Yes [provider]  estradiol (ESTRACE) 0.1 MG/GM vaginal cream Place 1 g vaginally at bedtime. 01/02/21  Yes [provider]  melatonin 3 MG TABS tablet Take 3 mg by mouth at bedtime.   Yes [provider]  Loma [place] (OYSTER CALCIUM) 500 MG TABS tablet Take 500 mg of elemental calcium by mouth daily.   Yes [provider]  pantoprazole (PROTONIX) 20 MG tablet Take 1 tablet (20 mg total) by mouth daily. Patient not taking: Reported on 01/06/2021 07/10/20   Charlesetta Shanks, MD    Allergies    Acetaminophen, Asa [aspirin], Codeine, and Tramadol  Review of Systems   Review of Systems  Constitutional:  Negative for activity change, appetite change and fever.  HENT:  Negative for congestion.   Respiratory:  Negative for cough, chest tightness and shortness of breath.   Cardiovascular:  Negative for chest pain.  Gastrointestinal:  Negative for abdominal pain, diarrhea, nausea and vomiting.  Genitourinary:  Negative for dysuria and hematuria.  Musculoskeletal:  Positive for arthralgias and myalgias.  Neurological:  Negative for dizziness, weakness and headaches.   all other systems are negative except as noted in the HPI and PMH.   Physical Exam Updated Vital Signs BP (!) 128/49    Pulse 82    Temp 98.2 F (36.8 C) (Oral)    Resp 16    Ht 5\' 6"  (  1.676 m)    Wt 62.1 kg    LMP 09/06/2011    SpO2 100%    BMI 22.11 kg/m   Physical Exam Vitals and nursing note reviewed.  Constitutional:      General: She is not in acute distress.    Appearance: She is well-developed.  HENT:     Head: Normocephalic and atraumatic.     Mouth/Throat:     Pharynx: No oropharyngeal exudate.  Eyes:     Conjunctiva/sclera: Conjunctivae normal.     Pupils: Pupils are equal, round, and reactive to light.  Neck:     Comments: No C spine tenderness Cardiovascular:     Rate and Rhythm: Normal rate and regular rhythm.     Heart sounds: Normal heart sounds. No murmur heard. Pulmonary:     Effort: Pulmonary effort is normal. No respiratory distress.     Breath sounds: Normal breath sounds.  Abdominal:     Palpations: Abdomen is soft.     Tenderness: There is no abdominal tenderness. There is no guarding or rebound.  Musculoskeletal:        General: Swelling, tenderness, deformity and signs of injury present.      Cervical back: Normal range of motion and neck supple.     Comments: Diffuse ecchymosis and swelling to right medial and lateral ankle.  Tenderness and edema extends up her entire lower leg.  Compartments are soft.  Swelling of the lower extremity.  Intact DP and PT pulse.  Able to wiggle toes.  Some flexion and extension intact of ankle but limited by pain.  No hip or knee pain.  Skin:    General: Skin is warm.  Neurological:     Mental Status: She is alert and oriented to person, place, and time.     Cranial Nerves: No cranial nerve deficit.     Motor: No abnormal muscle tone.     Coordination: Coordination normal.     Comments:  5/5 strength throughout. CN 2-12 intact.Equal grip strength.   Psychiatric:        Behavior: Behavior normal.    ED Results / Procedures / Treatments   Labs (all labs ordered are listed, but only abnormal results are displayed) Labs Reviewed - No data to display  EKG None  Radiology DG Tibia/Fibula Right  Result Date: 01/06/2021 CLINICAL DATA:  Fall. EXAM: RIGHT FOOT COMPLETE - 3+ VIEW; RIGHT ANKLE - COMPLETE 3+ VIEW; RIGHT TIBIA AND FIBULA - 2 VIEW COMPARISON:  None. FINDINGS: There is a mildly displaced spiral fracture of the distal tibial diaphysis with approximately 4 mm anterior and lateral displacement of the distal fracture fragment. Mildly displaced fracture of the proximal fibular diaphysis with approximately 4 mm anterior and lateral displacement of the distal fracture fragment. No other acute fracture. There is no dislocation. The bones are well mineralized. The soft tissues are unremarkable. IMPRESSION: Mildly displaced spiral fractures of the distal tibial and proximal fibular diaphysis. Electronically Signed   By: Anner Crete M.D.   On: 01/06/2021 00:57   DG Ankle Complete Right  Result Date: 01/06/2021 CLINICAL DATA:  Fall. EXAM: RIGHT FOOT COMPLETE - 3+ VIEW; RIGHT ANKLE - COMPLETE 3+ VIEW; RIGHT TIBIA AND FIBULA - 2 VIEW COMPARISON:   None. FINDINGS: There is a mildly displaced spiral fracture of the distal tibial diaphysis with approximately 4 mm anterior and lateral displacement of the distal fracture fragment. Mildly displaced fracture of the proximal fibular diaphysis with approximately 4 mm anterior and lateral displacement  of the distal fracture fragment. No other acute fracture. There is no dislocation. The bones are well mineralized. The soft tissues are unremarkable. IMPRESSION: Mildly displaced spiral fractures of the distal tibial and proximal fibular diaphysis. Electronically Signed   By: Anner Crete M.D.   On: 01/06/2021 00:57   DG Foot Complete Right  Result Date: 01/06/2021 CLINICAL DATA:  Fall. EXAM: RIGHT FOOT COMPLETE - 3+ VIEW; RIGHT ANKLE - COMPLETE 3+ VIEW; RIGHT TIBIA AND FIBULA - 2 VIEW COMPARISON:  None. FINDINGS: There is a mildly displaced spiral fracture of the distal tibial diaphysis with approximately 4 mm anterior and lateral displacement of the distal fracture fragment. Mildly displaced fracture of the proximal fibular diaphysis with approximately 4 mm anterior and lateral displacement of the distal fracture fragment. No other acute fracture. There is no dislocation. The bones are well mineralized. The soft tissues are unremarkable. IMPRESSION: Mildly displaced spiral fractures of the distal tibial and proximal fibular diaphysis. Electronically Signed   By: Anner Crete M.D.   On: 01/06/2021 00:57    Procedures Procedures   Medications Ordered in ED Medications  oxyCODONE (Oxy IR/ROXICODONE) immediate release tablet 10 mg (has no administration in time range)  ibuprofen (ADVIL) tablet 800 mg (800 mg Oral Given 01/05/21 2331)    ED Course  I have reviewed the triage vital signs and the nursing notes.  Pertinent labs & imaging results that were available during my care of the patient were reviewed by me and considered in my medical decision making (see chart for details).    MDM  Rules/Calculators/A&P                         Mechanical fall with right leg injury.  No loss of consciousness.  No head or neck injury.  Spiral fracture of distal tibial diaphysis and proximal fibula.  Discussed with Dr. Doreatha Martin of orthopedics.  He reviewed x-rays.  Agrees likely will need surgical stabilization of the tibia but not able to do this today due to OR limited staffing.  Recommends posterior stirrup and slab splints.  Crutches, nonweightbearing, pain control, call orthopedic office today for an appointment and get scheduled for surgical evaluation.  No open wounds.  Compartments are soft at this time.  Discussed return precautions including worsening pain, pain out of proportion, numbness, tingling, weakness, temperature change or any other concerns    Final Clinical Impression(s) / ED Diagnoses Final diagnoses:  Closed fracture of distal end of right tibia, unspecified fracture morphology, initial encounter    Rx / DC Orders ED Discharge Orders     None        Ariahna Smiddy, Annie Main, MD 01/06/21 [phone]

## 2021-01-06 NOTE — Discharge Instructions (Signed)
Do not put any weight on your right leg.  Use the crutches and keep leg elevated.  Take the pain medication as prescribed.  Call orthopedic office for an appointment this week.  Return to the ED with worsening pain, weakness, numbness, tingling, temperature change, any other concerns.

## 2021-01-07 ENCOUNTER — Encounter (HOSPITAL_COMMUNITY): Payer: Self-pay | Admitting: Orthopaedic Surgery

## 2021-01-07 ENCOUNTER — Other Ambulatory Visit: Payer: Self-pay

## 2021-01-07 NOTE — Progress Notes (Addendum)
Mrs. Carly Armstrong denies chest pain or shortness of breath.  Patient denies having any s/s of Covid in her household.  Patient denies any known exposure to Covid.   I instructed Mrs. Carly Armstrong to not use Marijuana until Dr says it is ok after surgery. I instructed patient to hold vitamins and NSAIDS.  Mrs. Carly Armstrong was seen by Bernita Buffy, PA-C at Dr. Bobbye Charleston office on 09/17/20 with complaint of chest pain.  Mrs Carly Armstrong has had a stressful year, chest pain was thought to be caused by anxiety,  not cardiac. A cardiac calcium score was performed om 10/21/20, the score was 0. Mrs Carly Armstrong was started on Buspar, she has not had any more complaints of chest pain.  I instructed patient to shower with antibiotic soap, if it is available.  Dry off with a clean towel. Do not put lotion, powder, cologne or deodorant or makeup.No jewelry or piercings. Men may shave their face and neck. Woman should not shave. No nail polish, artificial or acrylic nails. Wear clean clothes, brush your teeth. Glasses, contact lens,dentures or partials may not be worn in the OR. If you need to wear them, please bring a case for glasses, do not wear contacts or bring a case, the hospital does not have contact cases, dentures or partials will have to be removed , make sure they are clean, we will provide a denture cup to put them in. You will need some one to drive you home and a responsible person over the age of 16 to stay with you for the first 24 hours after surgery.

## 2021-01-07 NOTE — Anesthesia Preprocedure Evaluation (Addendum)
Anesthesia Evaluation  Patient identified by MRN, date of birth, ID band Patient awake    Reviewed: Allergy & Precautions, NPO status , Patient's Chart, lab work & pertinent test results  History of Anesthesia Complications Negative for: history of anesthetic complications  Airway Mallampati: II  TM Distance: >3 FB Neck ROM: Full    Dental no notable dental hx. (+) Teeth Intact, Dental Advisory Given   Pulmonary neg pulmonary ROS,    Pulmonary exam normal breath sounds clear to auscultation       Cardiovascular negative cardio ROS Normal cardiovascular exam Rhythm:Regular Rate:Normal     Neuro/Psych PSYCHIATRIC DISORDERS Anxiety negative neurological ROS     GI/Hepatic hiatal hernia, PUD, (+)     substance abuse (daily- last use 4d ago)  marijuana use,   Endo/Other  negative endocrine ROS  Renal/GU negative Renal ROS  negative genitourinary   Musculoskeletal R tibia fx   Abdominal   Peds  Hematology hct 38.6   Anesthesia Other Findings   Reproductive/Obstetrics negative OB ROS                            Anesthesia Physical Anesthesia Plan  ASA: 2  Anesthesia Plan: General and Regional   Post-op Pain Management: Regional block and Tylenol PO (pre-op)   Induction: Intravenous  PONV Risk Score and Plan: 3 and Ondansetron, Dexamethasone, Midazolam and Treatment may vary due to age or medical condition  Airway Management Planned: LMA  Additional Equipment: None  Intra-op Plan:   Post-operative Plan: Extubation in OR  Informed Consent: I have reviewed the patients History and Physical, chart, labs and discussed the procedure including the risks, benefits and alternatives for the proposed anesthesia with the patient or authorized representative who has indicated his/her understanding and acceptance.     Dental advisory given  Plan Discussed with: CRNA  Anesthesia Plan  Comments:        Anesthesia Quick Evaluation

## 2021-01-08 ENCOUNTER — Encounter (HOSPITAL_COMMUNITY): Payer: Self-pay | Admitting: Orthopaedic Surgery

## 2021-01-08 ENCOUNTER — Encounter (HOSPITAL_COMMUNITY)
Admission: RE | Disposition: A | Discharge status: Home / Self Care | Payer: Self-pay | Source: Home / Self Care | Attending: Orthopaedic Surgery

## 2021-01-08 ENCOUNTER — Inpatient Hospital Stay (HOSPITAL_COMMUNITY): Payer: 59 | Admitting: Certified Registered Nurse Anesthetist

## 2021-01-08 ENCOUNTER — Observation Stay (HOSPITAL_COMMUNITY)
Admission: RE | Admit: 2021-01-08 | Discharge: 2021-01-09 | Disposition: A | Discharge status: Home / Self Care | Payer: 59 | Attending: Orthopaedic Surgery | Admitting: Orthopaedic Surgery

## 2021-01-08 ENCOUNTER — Inpatient Hospital Stay (HOSPITAL_COMMUNITY): Payer: 59

## 2021-01-08 DIAGNOSIS — W000XXA Fall on same level due to ice and snow, initial encounter: Secondary | ICD-10-CM | POA: Insufficient documentation

## 2021-01-08 DIAGNOSIS — S82391A Other fracture of lower end of right tibia, initial encounter for closed fracture: Secondary | ICD-10-CM | POA: Diagnosis present

## 2021-01-08 DIAGNOSIS — Z20822 Contact with and (suspected) exposure to covid-19: Secondary | ICD-10-CM | POA: Insufficient documentation

## 2021-01-08 DIAGNOSIS — Z419 Encounter for procedure for purposes other than remedying health state, unspecified: Secondary | ICD-10-CM

## 2021-01-08 DIAGNOSIS — S82201A Unspecified fracture of shaft of right tibia, initial encounter for closed fracture: Secondary | ICD-10-CM

## 2021-01-08 HISTORY — PX: TIBIA IM NAIL INSERTION: SHX2516

## 2021-01-08 HISTORY — DX: Personal history of other diseases of the digestive system: Z87.19

## 2021-01-08 HISTORY — DX: Ulcerative colitis, unspecified, without complications: K51.90

## 2021-01-08 HISTORY — DX: Family history of other specified conditions: Z84.89

## 2021-01-08 HISTORY — DX: Anxiety disorder, unspecified: F41.9

## 2021-01-08 HISTORY — DX: Systemic involvement of connective tissue, unspecified: M35.9

## 2021-01-08 HISTORY — DX: Anemia, unspecified: D64.9

## 2021-01-08 LAB — CBC
HCT: 38.6 % (ref 36.0–46.0)
Hemoglobin: 12.7 g/dL (ref 12.0–15.0)
MCH: 30.5 pg (ref 26.0–34.0)
MCHC: 32.9 g/dL (ref 30.0–36.0)
MCV: 92.6 fL (ref 80.0–100.0)
Platelets: 237 10*3/uL (ref 150–400)
RBC: 4.17 MIL/uL (ref 3.87–5.11)
RDW: 13 % (ref 11.5–15.5)
WBC: 7.3 10*3/uL (ref 4.0–10.5)
nRBC: 0 % (ref 0.0–0.2)

## 2021-01-08 LAB — SARS CORONAVIRUS 2 BY RT PCR (HOSPITAL ORDER, PERFORMED IN ~~LOC~~ HOSPITAL LAB): SARS Coronavirus 2: NEGATIVE

## 2021-01-08 SURGERY — INSERTION, INTRAMEDULLARY ROD, TIBIA
Anesthesia: Regional | Laterality: Right

## 2021-01-08 MED ORDER — ONDANSETRON HCL 4 MG/2ML IJ SOLN: 4.0000 mg | Freq: Four times a day (QID) | INTRAMUSCULAR | Status: DC | PRN

## 2021-01-08 MED ORDER — DEXAMETHASONE SODIUM PHOSPHATE 10 MG/ML IJ SOLN
INTRAMUSCULAR | Status: DC | PRN
  Administered 2021-01-08: 09:00:00 10 mg

## 2021-01-08 MED ORDER — ONDANSETRON HCL 4 MG/2ML IJ SOLN
INTRAMUSCULAR | Status: AC
  Filled 2021-01-08: qty 2

## 2021-01-08 MED ORDER — CEFAZOLIN SODIUM-DEXTROSE 2-4 GM/100ML-% IV SOLN
2.0000 g | INTRAVENOUS | Status: AC
  Administered 2021-01-08: 10:00:00 2 g via INTRAVENOUS
  Filled 2021-01-08: qty 100

## 2021-01-08 MED ORDER — OXYCODONE HCL 5 MG PO TABS: 5.0000 mg | ORAL_TABLET | ORAL | Status: DC | PRN

## 2021-01-08 MED ORDER — MIDAZOLAM HCL 2 MG/2ML IJ SOLN
INTRAMUSCULAR | Status: AC
  Filled 2021-01-08: qty 2

## 2021-01-08 MED ORDER — VITAMIN D 25 MCG (1000 UNIT) PO TABS
4000.0000 [IU] | ORAL_TABLET | Freq: Every day | ORAL | Status: DC
Start: 2021-01-08 — End: 2021-01-09
  Administered 2021-01-08 – 2021-01-09 (×2): 4000 [IU] via ORAL
  Filled 2021-01-08 (×2): qty 4

## 2021-01-08 MED ORDER — SODIUM CHLORIDE 0.9 % IV SOLN: INTRAVENOUS | Status: DC

## 2021-01-08 MED ORDER — PROPOFOL 10 MG/ML IV BOLUS
INTRAVENOUS | Status: AC
  Filled 2021-01-08: qty 20

## 2021-01-08 MED ORDER — ONDANSETRON HCL 4 MG/2ML IJ SOLN
INTRAMUSCULAR | Status: DC | PRN
  Administered 2021-01-08: 4 mg via INTRAVENOUS

## 2021-01-08 MED ORDER — 0.9 % SODIUM CHLORIDE (POUR BTL) OPTIME
TOPICAL | Status: DC | PRN
  Administered 2021-01-08: 10:00:00 1000 mL

## 2021-01-08 MED ORDER — ESTRADIOL 0.1 MG/GM VA CREA
1.0000 g | TOPICAL_CREAM | Freq: Every day | VAGINAL | Status: DC
  Administered 2021-01-08: 22:00:00 1 g via VAGINAL
  Filled 2021-01-08: qty 42.5

## 2021-01-08 MED ORDER — MIDAZOLAM HCL 2 MG/2ML IJ SOLN: 2.0000 mg | Freq: Once | INTRAMUSCULAR | Status: AC

## 2021-01-08 MED ORDER — MELATONIN 3 MG PO TABS
3.0000 mg | ORAL_TABLET | Freq: Every day | ORAL | Status: DC
  Administered 2021-01-08: 22:00:00 3 mg via ORAL
  Filled 2021-01-08: qty 1

## 2021-01-08 MED ORDER — DIPHENHYDRAMINE HCL 50 MG/ML IJ SOLN
INTRAMUSCULAR | Status: DC | PRN
  Administered 2021-01-08: 12.5 mg via INTRAVENOUS

## 2021-01-08 MED ORDER — VANCOMYCIN HCL 1000 MG IV SOLR
INTRAVENOUS | Status: AC
  Filled 2021-01-08: qty 20

## 2021-01-08 MED ORDER — ACETAMINOPHEN 500 MG PO TABS: 1000.0000 mg | ORAL_TABLET | Freq: Once | ORAL | Status: DC

## 2021-01-08 MED ORDER — ROPIVACAINE HCL 5 MG/ML IJ SOLN
INTRAMUSCULAR | Status: DC | PRN
  Administered 2021-01-08: 40 mL via PERINEURAL

## 2021-01-08 MED ORDER — BUSPIRONE HCL 5 MG PO TABS
5.0000 mg | ORAL_TABLET | Freq: Every day | ORAL | Status: DC
  Administered 2021-01-08: 22:00:00 5 mg via ORAL
  Filled 2021-01-08: qty 1

## 2021-01-08 MED ORDER — ONDANSETRON HCL 4 MG PO TABS: 4.0000 mg | ORAL_TABLET | Freq: Four times a day (QID) | ORAL | Status: DC | PRN

## 2021-01-08 MED ORDER — DEXMEDETOMIDINE (PRECEDEX) IN NS 20 MCG/5ML (4 MCG/ML) IV SYRINGE
PREFILLED_SYRINGE | INTRAVENOUS | Status: AC
  Filled 2021-01-08: qty 5

## 2021-01-08 MED ORDER — OXYCODONE HCL 5 MG PO TABS: 5.0000 mg | ORAL_TABLET | Freq: Once | ORAL | Status: DC | PRN

## 2021-01-08 MED ORDER — DOCUSATE SODIUM 100 MG PO CAPS
100.0000 mg | ORAL_CAPSULE | Freq: Two times a day (BID) | ORAL | Status: DC
  Administered 2021-01-08 – 2021-01-09 (×2): 100 mg via ORAL
  Filled 2021-01-08 (×2): qty 1

## 2021-01-08 MED ORDER — FENTANYL CITRATE (PF) 100 MCG/2ML IJ SOLN: 100.0000 ug | Freq: Once | INTRAMUSCULAR | Status: AC

## 2021-01-08 MED ORDER — PANTOPRAZOLE SODIUM 20 MG PO TBEC
20.0000 mg | DELAYED_RELEASE_TABLET | Freq: Every day | ORAL | Status: DC
  Administered 2021-01-08: 17:00:00 20 mg via ORAL
  Filled 2021-01-08 (×2): qty 1

## 2021-01-08 MED ORDER — FENTANYL CITRATE (PF) 250 MCG/5ML IJ SOLN
INTRAMUSCULAR | Status: AC
  Filled 2021-01-08: qty 5

## 2021-01-08 MED ORDER — FENTANYL CITRATE (PF) 100 MCG/2ML IJ SOLN
INTRAMUSCULAR | Status: AC
  Administered 2021-01-08: 08:00:00 100 ug via INTRAVENOUS
  Filled 2021-01-08: qty 2

## 2021-01-08 MED ORDER — PROMETHAZINE HCL 25 MG/ML IJ SOLN: 6.2500 mg | INTRAMUSCULAR | Status: DC | PRN

## 2021-01-08 MED ORDER — HYDROMORPHONE HCL 1 MG/ML IJ SOLN
INTRAMUSCULAR | Status: AC
  Filled 2021-01-08: qty 1

## 2021-01-08 MED ORDER — DEXMEDETOMIDINE (PRECEDEX) IN NS 20 MCG/5ML (4 MCG/ML) IV SYRINGE
PREFILLED_SYRINGE | INTRAVENOUS | Status: DC | PRN
  Administered 2021-01-08 (×2): 8 ug via INTRAVENOUS

## 2021-01-08 MED ORDER — LIDOCAINE 2% (20 MG/ML) 5 ML SYRINGE
INTRAMUSCULAR | Status: DC | PRN
  Administered 2021-01-08: 40 mg via INTRAVENOUS

## 2021-01-08 MED ORDER — LIDOCAINE 2% (20 MG/ML) 5 ML SYRINGE
INTRAMUSCULAR | Status: AC
  Filled 2021-01-08: qty 5

## 2021-01-08 MED ORDER — ACETAMINOPHEN 500 MG PO TABS
1000.0000 mg | ORAL_TABLET | Freq: Four times a day (QID) | ORAL | Status: DC
  Administered 2021-01-08: 17:00:00 1000 mg via ORAL
  Filled 2021-01-08 (×2): qty 2

## 2021-01-08 MED ORDER — MIDAZOLAM HCL 2 MG/2ML IJ SOLN
INTRAMUSCULAR | Status: DC | PRN
  Administered 2021-01-08: 2 mg via INTRAVENOUS

## 2021-01-08 MED ORDER — PROPOFOL 10 MG/ML IV BOLUS
INTRAVENOUS | Status: DC | PRN
  Administered 2021-01-08: 150 mg via INTRAVENOUS
  Administered 2021-01-08: 50 mg via INTRAVENOUS

## 2021-01-08 MED ORDER — MIDAZOLAM HCL 2 MG/2ML IJ SOLN
INTRAMUSCULAR | Status: AC
  Administered 2021-01-08: 08:00:00 2 mg via INTRAVENOUS
  Filled 2021-01-08: qty 2

## 2021-01-08 MED ORDER — ORAL CARE MOUTH RINSE: 15.0000 mL | Freq: Once | OROMUCOSAL | Status: AC

## 2021-01-08 MED ORDER — HYDROMORPHONE HCL 1 MG/ML IJ SOLN
0.5000 mg | INTRAMUSCULAR | Status: DC | PRN
  Administered 2021-01-09: 07:00:00 1 mg via INTRAVENOUS
  Filled 2021-01-08: qty 1

## 2021-01-08 MED ORDER — TRANEXAMIC ACID-NACL 1000-0.7 MG/100ML-% IV SOLN
1000.0000 mg | INTRAVENOUS | Status: AC
  Administered 2021-01-08: 10:00:00 1000 mg via INTRAVENOUS
  Filled 2021-01-08: qty 100

## 2021-01-08 MED ORDER — OXYCODONE HCL 5 MG/5ML PO SOLN: 5.0000 mg | Freq: Once | ORAL | Status: DC | PRN

## 2021-01-08 MED ORDER — OXYCODONE HCL 5 MG PO TABS
10.0000 mg | ORAL_TABLET | ORAL | Status: DC | PRN
  Administered 2021-01-09 (×2): 10 mg via ORAL
  Filled 2021-01-08 (×2): qty 2

## 2021-01-08 MED ORDER — VANCOMYCIN HCL 1000 MG IV SOLR
INTRAVENOUS | Status: DC | PRN
  Administered 2021-01-08: 1000 mg via TOPICAL

## 2021-01-08 MED ORDER — DEXAMETHASONE SODIUM PHOSPHATE 10 MG/ML IJ SOLN
INTRAMUSCULAR | Status: DC | PRN
  Administered 2021-01-08: 5 mg via INTRAVENOUS

## 2021-01-08 MED ORDER — LACTATED RINGERS IV SOLN: INTRAVENOUS | Status: DC

## 2021-01-08 MED ORDER — HYDROMORPHONE HCL 1 MG/ML IJ SOLN
0.2500 mg | INTRAMUSCULAR | Status: DC | PRN
  Administered 2021-01-08: 12:00:00 0.5 mg via INTRAVENOUS

## 2021-01-08 MED ORDER — AMISULPRIDE (ANTIEMETIC) 5 MG/2ML IV SOLN: 10.0000 mg | Freq: Once | INTRAVENOUS | Status: DC | PRN

## 2021-01-08 MED ORDER — CHLORHEXIDINE GLUCONATE 0.12 % MT SOLN
15.0000 mL | Freq: Once | OROMUCOSAL | Status: AC
  Administered 2021-01-08: 07:00:00 15 mL via OROMUCOSAL
  Filled 2021-01-08: qty 15

## 2021-01-08 SURGICAL SUPPLY — 59 items
BAG COUNTER SPONGE SURGICOUNT (BAG) ×2 IMPLANT
BAG SURGICOUNT SPONGE COUNTING (BAG) ×1
BIT DRILL 4.3 CALIBRATED (DRILL) IMPLANT
BIT DRILL 4.3 FREE (DRILL) IMPLANT
BIT DRILL SURG TIB 3.3X152.5 (DRILL) IMPLANT
BLADE SURG 10 STRL SS (BLADE) ×6 IMPLANT
BNDG COHESIVE 4X5 TAN STRL (GAUZE/BANDAGES/DRESSINGS) ×3 IMPLANT
BNDG ELASTIC 4X5.8 VLCR STR LF (GAUZE/BANDAGES/DRESSINGS) ×3 IMPLANT
BNDG ELASTIC 6X5.8 VLCR STR LF (GAUZE/BANDAGES/DRESSINGS) ×3 IMPLANT
BNDG GAUZE ELAST 4 BULKY (GAUZE/BANDAGES/DRESSINGS) ×3 IMPLANT
BRUSH SCRUB EZ PLAIN DRY (MISCELLANEOUS) ×6 IMPLANT
CHLORAPREP W/TINT 26 (MISCELLANEOUS) ×3 IMPLANT
COVER SURGICAL LIGHT HANDLE (MISCELLANEOUS) ×6 IMPLANT
DRAPE C-ARM 42X72 X-RAY (DRAPES) ×3 IMPLANT
DRAPE C-ARMOR (DRAPES) ×3 IMPLANT
DRAPE HALF SHEET 40X57 (DRAPES) ×6 IMPLANT
DRAPE IMP U-DRAPE 54X76 (DRAPES) ×6 IMPLANT
DRAPE INCISE IOBAN 66X45 STRL (DRAPES) IMPLANT
DRAPE ORTHO SPLIT 77X108 STRL (DRAPES) ×6
DRAPE SURG ORHT 6 SPLT 77X108 (DRAPES) ×2 IMPLANT
DRAPE U-SHAPE 47X51 STRL (DRAPES) ×3 IMPLANT
DRILL 4.3 CALIBRATED (DRILL) ×3
DRILL 4.3 FREE (DRILL) ×3
DRILL SURG TIB 3.3X152.5 (DRILL) ×3
DRSG ADAPTIC 3X8 NADH LF (GAUZE/BANDAGES/DRESSINGS) ×3 IMPLANT
DRSG XEROFORM 1X8 (GAUZE/BANDAGES/DRESSINGS) ×2 IMPLANT
ELECT REM PT RETURN 9FT ADLT (ELECTROSURGICAL) ×3
ELECTRODE REM PT RTRN 9FT ADLT (ELECTROSURGICAL) ×1 IMPLANT
GAUZE SPONGE 4X4 12PLY STRL (GAUZE/BANDAGES/DRESSINGS) ×3 IMPLANT
GAUZE SPONGE 4X4 12PLY STRL LF (GAUZE/BANDAGES/DRESSINGS) ×2 IMPLANT
GLOVE SURG ENC MOIS LTX SZ6.5 (GLOVE) ×9 IMPLANT
GLOVE SURG ENC MOIS LTX SZ7.5 (GLOVE) ×12 IMPLANT
GLOVE SURG UNDER POLY LF SZ6.5 (GLOVE) ×3 IMPLANT
GLOVE SURG UNDER POLY LF SZ7.5 (GLOVE) ×3 IMPLANT
GOWN STRL REUS W/ TWL LRG LVL3 (GOWN DISPOSABLE) ×2 IMPLANT
GOWN STRL REUS W/TWL LRG LVL3 (GOWN DISPOSABLE) ×6
GUIDEPIN 3.0 THREADED 305MM (PIN) ×2 IMPLANT
GUIDEWIRE 3.0X100MM BALL TIP (WIRE) ×2 IMPLANT
KIT BASIN OR (CUSTOM PROCEDURE TRAY) ×3 IMPLANT
KIT TURNOVER KIT B (KITS) ×3 IMPLANT
NAIL TIB UNIV Z 8.3X32 (Nail) ×2 IMPLANT
PACK TOTAL JOINT (CUSTOM PROCEDURE TRAY) ×3 IMPLANT
PAD ABD 8X10 STRL (GAUZE/BANDAGES/DRESSINGS) ×2 IMPLANT
PAD ARMBOARD 7.5X6 YLW CONV (MISCELLANEOUS) ×6 IMPLANT
PAD CAST 4YDX4 CTTN HI CHSV (CAST SUPPLIES) IMPLANT
PADDING CAST COTTON 4X4 STRL (CAST SUPPLIES) ×3
SCREW BONE 5.0X37.5MM CORT Z (Screw) ×2 IMPLANT
SCREW CANC FEM FA 5X30 (Screw) ×2 IMPLANT
SCREW CORT FA 4X40 (Screw) ×2 IMPLANT
SCREW CORT IM NAIL 4X30 (Screw) ×2 IMPLANT
STAPLER VISISTAT 35W (STAPLE) ×3 IMPLANT
SUT MNCRL AB 3-0 PS2 18 (SUTURE) ×3 IMPLANT
SUT VIC AB 0 CT1 27 (SUTURE)
SUT VIC AB 0 CT1 27XBRD ANBCTR (SUTURE) IMPLANT
SUT VIC AB 2-0 CT1 27 (SUTURE)
SUT VIC AB 2-0 CT1 TAPERPNT 27 (SUTURE) IMPLANT
TOWEL GREEN STERILE (TOWEL DISPOSABLE) ×6 IMPLANT
TOWEL GREEN STERILE FF (TOWEL DISPOSABLE) ×3 IMPLANT
YANKAUER SUCT BULB TIP NO VENT (SUCTIONS) IMPLANT

## 2021-01-08 NOTE — Interval H&P Note (Signed)
History and Physical Interval Note:  01/08/2021 9:20 AM  Carly Armstrong  has presented today for surgery, with the diagnosis of Right tibia fracture.  The various methods of treatment have been discussed with the patient and family. After consideration of risks, benefits and other options for treatment, the patient has consented to  Procedure(s): INTRAMEDULLARY (IM) NAIL TIBIAL (Right) as a surgical intervention.  The patient's history has been reviewed, patient examined, no change in status, stable for surgery.  I have reviewed the patient's chart and labs.  Questions were answered to the patient's satisfaction.     Vanetta Mulders

## 2021-01-08 NOTE — H&P (Signed)
Chief Complaint: Right leg pain     History of Present Illness:    TOREE EDLING is a 50 y.o. female with right leg pain after a trip and fall on ice.  She is otherwise healthy and active.  She presented to the emergency room was found to have a tibia fracture there is no concern for compartment syndrome.  She was discharged home for plan for outpatient management.    Surgical History:   None  PMH/PSH/Family History/Social History/Meds/Allergies:    Past Medical History:  Diagnosis Date   Anemia    Anxiety    Autoimmune disease ([place])    not an issue in years.   Family history of adverse reaction to anesthesia    History of hiatal hernia    Ulcerative colitis ([place])    20's and 30's   Past Surgical History:  Procedure Laterality Date   CESAREAN SECTION     COLONOSCOPY W/ POLYPECTOMY     WISDOM TOOTH EXTRACTION     Social History   Socioeconomic History   Marital status: Married    Spouse name: Not on file   Number of children: Not on file   Years of education: Not on file   Highest education level: Not on file  Occupational History   Not on file  Tobacco Use   Smoking status: Never   Smokeless tobacco: Never  Vaping Use   Vaping Use: Never used  Substance and Sexual Activity   Alcohol use: Not Currently    Comment: 2 wine- 2 times a month   Drug use: Yes    Frequency: 7.0 times per week    Types: Marijuana   Sexual activity: Not on file  Other Topics Concern   Not on file  Social History Narrative   Not on file   Social Determinants of Health   Financial Resource Strain: Not on file  Food Insecurity: Not on file  Transportation Needs: Not on file  Physical Activity: Not on file  Stress: Not on file  Social Connections: Not on file   History reviewed. No pertinent family history. Allergies  Allergen Reactions   Acetaminophen    Asa [Aspirin]    Codeine    Tramadol Other (See Comments)   Current  Facility-Administered Medications  Medication Dose Route Frequency Provider Last Rate Last Admin   ceFAZolin (ANCEF) IVPB 2g/100 mL premix  2 g Intravenous On Call to OR Vanetta Mulders, MD       lactated ringers infusion   Intravenous Continuous Pervis Hocking, DO 10 mL/hr at 01/08/21 0725 New Bag at 01/08/21 0725   tranexamic acid (CYKLOKAPRON) IVPB 1,000 mg  1,000 mg Intravenous To OR Vanetta Mulders, MD       No results found.  Review of Systems:   A ROS was performed including pertinent positives and negatives as documented in the HPI.  Physical Exam :   Constitutional: NAD and appears stated age Neurological: Alert and oriented Psych: Appropriate affect and cooperative Blood pressure (!) 104/45, pulse 63, temperature 98.2 F (36.8 C), temperature source Oral, resp. rate 14, height 5\' 6"  (1.676 m), weight 59 kg, last menstrual period 09/06/2011, SpO2 96 %.   Comprehensive Musculoskeletal Exam:    Splint is in place.  She is able to fire EHL tibialis anterior as well  as plantarflex all the toes.  Sensation is intact in all of her exposed toes.  Toes are warm and well-perfused.  Imaging:   Xray (3 view right knee, 2 view right tib-fib, 3 view right ankle): Displaced proximal fibula as well as distal third spiral tibia fracture    I personally reviewed and interpreted the radiographs.   Assessment:   50 year old female with right tib-fib fracture after a fall and slipped on ice.  At this time I have advised ultimately intramedullary fixation with rod in order to optimize her long-term outcome.  Discussed that since this is a weightbearing implant this would allow for earlier weightbearing.  She would like to proceed with this after discussion of operative versus nonoperative treatments.  Discussed prolonged cast immobilization without surgery. Plan :    -Plan for right tibia intramedullary nail fixation     I personally saw and evaluated the patient, and participated  in the management and treatment plan.  Vanetta Mulders, MD Attending Physician, Orthopedic Surgery  This document was dictated using Dragon voice recognition software. A reasonable attempt at proof reading has been made to minimize errors.

## 2021-01-08 NOTE — Discharge Instructions (Addendum)
Discharge Instructions    Attending Surgeon: Vanetta Mulders, MD Office Phone Number: [phone]   Diagnosis and Procedures:    Surgeries Performed: Right tibia intramedullary nail  Discharge Plan:    Diet: Resume usual diet. Begin with light or bland foods.  Drink plenty of fluids.  Activity:  Weight bearing as tolerated utilizing crutches.   GENERAL INSTRUCTIONS: 1.  Keep your surgical site elevated above your heart for at least 5-7 days or longer to prevent swelling. This will improve your comfort and your overall recovery following surgery.     2. Please call Dr. Eddie Dibbles office at [phone] with questions Monday-Friday during business hours. If no one answers, please leave a message and someone should get back to the patient within 24 hours. For emergencies please call 911 or proceed to the emergency room.   3. Patient to notify surgical team if experiences any of the following: Bowel/Bladder dysfunction, uncontrolled pain, nerve/muscle weakness, incision with increased drainage or redness, nausea/vomiting and Fever greater than 101.0 F.  Be alert for signs of infection including redness, streaking, odor, fever or chills. Be alert for excessive pain or bleeding and notify your surgeon immediately.  WOUND INSTRUCTIONS:   Leave your dressing/cast/splint in place until your post operative visit.  Keep it clean and dry.  Always keep the incision clean and dry until the staples/sutures are removed. If there is no drainage from the incision you should keep it open to air. If there is drainage from the incision you must keep it covered at all times until the drainage stops  Do not soak in a bath tub, hot tub, pool, lake or other body of water until 21 days after your surgery and your incision is completely dry and healed.  If you have removable sutures (or staples) they must be removed 10-14 days (unless otherwise instructed) from the day of your surgery.     1)   Elevate the extremity as much as possible.  2)  Keep the dressing clean and dry.  3)  Please call us if the dressing becomes wet or dirty.  4)  If you are experiencing worsening pain or worsening swelling, please call.     MEDICATIONS: Resume all previous home medications at the previous prescribed dose and frequency unless otherwise noted Start taking the  pain medications on an as-needed basis as prescribed  Please taper down pain medication over the next week following surgery.  Ideally you should not require a refill of any narcotic pain medication.  Take pain medication with food to minimize nausea. In addition to the prescribed pain medication, you may take over-the-counter pain relievers such as Tylenol.  Do NOT take additional tylenol if your pain medication already has tylenol in it.  Aspirin 325mg  daily for four weeks.      FOLLOWUP INSTRUCTIONS: 1. Follow up at the [facility] 3-4 days following surgery. This appointment should be scheduled unless other arrangements have been made.The Physical Therapy scheduling number is [phone] if an appointment has not already been arranged.  2. Contact Dr. Eddie Dibbles office during office hours at [phone] or the practice after hours line at [phone] for non-emergencies. For medical emergencies call 911.   Discharge Location: Home   ================================================================  Enoxaparin injection What is this medicine? ENOXAPARIN (ee nox a PA rin) is used after knee, hip, or abdominal surgeries to prevent blood clotting. It is also used to treat existing blood clots in the lungs or in the veins.  This medicine may be used for other purposes; ask your health care provider or pharmacist if you have questions. COMMON BRAND NAME(S): Lovenox  What should I tell my health care provider before I take this medicine? They need to know if you have any of these conditions: bleeding disorders,  hemorrhage, or hemophilia infection of the heart or heart valves kidney or liver disease previous stroke prosthetic heart valve recent surgery or delivery of a baby ulcer in the stomach or intestine, diverticulitis, or other bowel disease an unusual or allergic reaction to enoxaparin, heparin, pork or pork products, other medicines, foods, dyes, or preservatives pregnant or trying to get pregnant breast-feeding  How should I use this medicine? This medicine is for injection under the skin. It is usually given by a health-care professional. You or a family member may be trained on how to give the injections. If you are to give yourself injections, make sure you understand how to use the syringe, measure the dose if necessary, and give the injection. To avoid bruising, do not rub the site where this medicine has been injected. Do not take your medicine more often than directed. Do not stop taking except on the advice of your doctor or health care professional. Make sure you receive a puncture-resistant container to dispose of the needles and syringes once you have finished with them. Do not reuse these items. Return the container to your doctor or health care professional for proper disposal. Talk to your pediatrician regarding the use of this medicine in children. Special care may be needed. Overdosage: If you think you have taken too much of this medicine contact a poison control center or emergency room at once. NOTE: This medicine is only for you. Do not share this medicine with others.  See this Video for how to administer Enoxaparin: [email or web address].com/patient-self-[email or web address]-video   What if I miss a dose? If you miss a dose, take it as soon as you can. If it is almost time for your next dose, take only that dose. Do not take double or extra doses.  What may interact with this medicine? aspirin and aspirin-like medicines certain medicines that treat or prevent blood  clots dipyridamole NSAIDs, medicines for pain and inflammation, like ibuprofen or naproxen This list may not describe all possible interactions. Give your health care provider a list of all the medicines, herbs, non-prescription drugs, or dietary supplements you use. Also tell them if you smoke, drink alcohol, or use illegal drugs. Some items may interact with your medicine.  What should I watch for while using this medicine? Visit your healthcare professional for regular checks on your progress. You may need blood work done while you are taking this medicine. Your condition will be monitored carefully while you are receiving this medicine. It is important not to miss any appointments. If you are going to need surgery or other procedure, tell your healthcare professional that you are using this medicine. Using this medicine for a long time may weaken your bones and increase the risk of bone fractures. Avoid sports and activities that might cause injury while you are using this medicine. Severe falls or injuries can cause unseen bleeding. Be careful when using sharp tools or knives. Consider using an Copy. Take special care brushing or flossing your teeth. Report any injuries, bruising, or red spots on the skin to your healthcare professional. Wear a medical ID bracelet or chain. Carry a card that describes your disease and details of your medicine and dosage times.  What side effects may I notice from receiving this medicine? Side effects that you should report to your doctor or health care professional as soon as possible: allergic reactions like skin rash, itching or hives, swelling of the face, lips, or tongue bone pain signs and symptoms of bleeding such as bloody or black, tarry stools; red or dark-brown urine; spitting up blood or brown material that looks like coffee grounds; red spots on the skin; unusual bruising or bleeding from the eye, gums, or nose signs and symptoms of a  blood clot such as chest pain; shortness of breath; pain, swelling, or warmth in the leg signs and symptoms of a stroke such as changes in vision; confusion; trouble speaking or understanding; severe headaches; sudden numbness or weakness of the face, arm or leg; trouble walking; dizziness; loss of coordination Side effects that usually do not require medical attention (report to your doctor or health care professional if they continue or are bothersome): hair loss pain, redness, or irritation at site where injected This list may not describe all possible side effects. Call your doctor for medical advice about side effects. You may report side effects to FDA at 1-800-FDA-1088.  Where should I keep my medicine? Keep out of the reach of children. Store at room temperature between 15 and 30 degrees C (59 and 86 degrees F). Do not freeze. If your injections have been specially prepared, you may need to store them in the refrigerator. Ask your pharmacist. Throw away any unused medicine after the expiration date.   NOTE: This sheet is a summary. It may not cover all possible information. If you have questions about this medicine, talk to your doctor, pharmacist, or health care provider.  2020 Elsevier/Gold Standard (2016-12-24 11:25:34)   =======================================================================  Anticoagulant Injection Instructions Using a Prefilled Syringe    Injectable blood thinners (anticoagulants) are medicines that help prevent blood clots from developing. Two common injectable anticoagulant medicines that are used are fondaparinux and enoxaparin. Injectable anticoagulant medicines are given with a single-use syringe that already has medicine inside of it (prefilled syringe). You inject the medicine through a needle into the layer of fat and tissue between skin and muscle (subcutaneous) in your belly. Before your first injection, your health care provider will instruct you on  how to take your anticoagulant medicine at home. Also read the medication guide or package insert that came with the prefilled syringe. Follow directions from the guide about how to prepare and give the injection.  What are the risks? Generally, self-injection of anticoagulant medicine is safe. However, mild problems can occur, including mild bleeding, itching, or rash at the injection site. Other risks may include: Bleeding and bruising. A low platelet count (thrombocytopenia). An allergic reaction to the medication. Liver damage. A low red blood cell count (anemia).  Supplies needed: To inject the medicine, you will need: Alcohol wipes. A prefilled syringe with needle. A container for syringe disposal. This may be a puncture-proof sharps container or a hard-sided plastic container with a cover, such as an empty laundry detergent bottle.  How to inject anticoagulant medicine Wash your hands with soap and water. If soap and water are not available, use alcohol-based hand sanitizer. Locate the site on your belly where the medicine should be injected. Avoid the area within 2 inches (5 cm) of your navel (umbilicus). Use an alcohol wipe to clean the site where you will be injecting the needle. Let the site air-dry. Remove the plastic cover from the needle on the  syringe. Do not let the needle touch anything. Hold the syringe with one hand and use your other hand to twist the rigid needle guard (covering the needle) counterclockwise. Pull the needle guard straight off the needle. Discard the needle guard. When using a prefilled syringe, do not push the air bubble out of the syringe before the injection. The air bubble will help you get all of the medicine out of the syringe and into your subcutaneous tissue. Hold the syringe in your writing hand like a pencil. Use your other hand to pinch and hold about an inch (2.5 cm) of skin. Do not directly touch the cleaned part of the skin. Insert the  entire needle straight into the fold of skin. The needle should be at a 90-degree angle (perpendicular) to the skin. Push the needle all the way against the skin. After the needle is completely inserted into the skin, release the skin that you are pinching. Continue to hold the syringe with your writing hand. Use the thumb or index finger of your writing hand to push the plunger all the way into the syringe to inject the medicine. Pull the needle straight out of the skin. Press and hold an alcohol wipe over the injection site until the bleeding stops. Do not rub the area. Cover the injection site with a bandage, if needed. Do not recap the needle. If your syringe has a safety system for shielding the needle after injection: Firmly push down on the plunger after you complete the injection. The protective sleeve will automatically cover the needle, and you will hear a click. The click means that the needle is safely covered. Place the syringe and needle in the disposal container.  What else do I need to know? Do not use the syringe or needle more than one time. Change the injection site each time you give yourself a shot. Before an injection, make sure the medicine is a clear and colorless or pale yellow. Do not use your medicine if it is discolored or has particles in it. Let your health care provider know right away. Tell your health care providers, including dentists: That you are taking an anticoagulant, especially if you are injured or plan to have a procedure. If there is a change in your illness, and any other changes in medicines, supplements, or diet. Take over-the-counter and prescription medicines only as told by your health care provider. Keep your medicine safely stored at room temperature. Keep all follow-up visits as told by your health care provider.  Contact a health care provider if: You develop any rashes on your skin. You have large areas of bruising on your skin. Your  condition worsens. You develop a fever. There is blood in your urine. You are bleeding from your gums.  Get help right away if: You develop bleeding problems such as: Vomiting blood or coughing up blood. Dark red blood in your urine. Blood in your stool or your stool has a dark, tarry, or coffee-ground appearance. You have bleeding that does not stop. You develop chest pain or shortness of breath. You develop a severe headache or confusion.  These symptoms may represent a serious problem that is an emergency. Do not wait to see if the symptoms will go away. Get medical help right away. Call your local emergency services (911 in the [place] Do not drive yourself to the hospital. Summary Injectable blood thinners (anticoagulants) are medicines that help prevent blood clots from developing in the veins. You inject the medicine  through a needle into the layer of fat and tissue between skin and muscle (subcutaneous) in your belly. Keep all follow-up visits as told by your health care provider. Follow-up visits include visits for lab tests. This information is not intended to replace advice given to you by your health care provider. Make sure you discuss any questions you have with your health care provider. Document Revised: 02/06/2017 Document Reviewed: 02/06/2017 Elsevier Patient Education  2020 [employer].

## 2021-01-08 NOTE — Anesthesia Procedure Notes (Signed)
Anesthesia Regional Block: Popliteal block   Pre-Anesthetic Checklist: , timeout performed,  Correct Patient, Correct Site, Correct Laterality,  Correct Procedure, Correct Position, site marked,  Risks and benefits discussed,  Surgical consent,  Pre-op evaluation,  At surgeon's request and post-op pain management  Laterality: Right  Prep: Maximum Sterile Barrier Precautions used, chloraprep       Needles:  Injection technique: Single-shot  Needle Type: Echogenic Stimulator Needle     Needle Length: 9cm  Needle Gauge: 22     Additional Needles:   Procedures:,,,, ultrasound used (permanent image in chart),,    Narrative:  Start time: 01/08/2021 9:10 AM End time: 01/08/2021 9:15 AM Injection made incrementally with aspirations every 5 mL.  Performed by: Personally  Anesthesiologist: Pervis Hocking, DO  Additional Notes: Monitors applied. No increased pain on injection. No increased resistance to injection. Injection made in 5cc increments. Good needle visualization. Patient tolerated procedure well.

## 2021-01-08 NOTE — Progress Notes (Signed)
Orthopedic Tech Progress Note Patient Details:  Carly Armstrong 1970/08/30 [id number]  Ortho Devices Type of Ortho Device: CAM walker Ortho Device/Splint Interventions: Ordered      Linus Salmons Vance Hochmuth 01/08/2021, 11:34 AM

## 2021-01-08 NOTE — Anesthesia Postprocedure Evaluation (Signed)
Anesthesia Post Note  Patient: Carly Armstrong  Procedure(s) Performed: INTRAMEDULLARY (IM) NAIL TIBIAL (Right)     Patient location during evaluation: PACU Anesthesia Type: Regional and General Level of consciousness: awake and alert, oriented and patient cooperative Pain management: pain level controlled Vital Signs Assessment: post-procedure vital signs reviewed and stable Respiratory status: spontaneous breathing, nonlabored ventilation and respiratory function stable Cardiovascular status: blood pressure returned to baseline and stable Postop Assessment: no apparent nausea or vomiting Anesthetic complications: no Comments: Chronic back 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 RLE nerve block working well.   No notable events documented.  Last Vitals:  Vitals:   01/08/21 1200 01/08/21 1220  BP: (!) 102/50 (!) 112/53  Pulse: 61 71  Resp: 15 13  Temp: 36.8 C   SpO2: 100% 100%    Last Pain:  Vitals:   01/08/21 1225  TempSrc:   PainSc: [name]

## 2021-01-08 NOTE — Transfer of Care (Signed)
Immediate Anesthesia Transfer of Care Note  Patient: Carly Armstrong  Procedure(s) Performed: INTRAMEDULLARY (IM) NAIL TIBIAL (Right)  Patient Location: PACU  Anesthesia Type:General and Regional  Level of Consciousness: drowsy  Airway & Oxygen Therapy: Patient Spontanous Breathing and Patient connected to face mask oxygen  Post-op Assessment: Report given to RN and Post -op Vital signs reviewed and stable  Post vital signs: Reviewed and stable  Last Vitals:  Vitals Value Taken Time  BP 102/50 01/08/21 1208  Temp    Pulse 59 01/08/21 1208  Resp 14 01/08/21 1208  SpO2 100 % 01/08/21 1208  Vitals shown include unvalidated device data.  Last Pain:  Vitals:   01/08/21 0723  TempSrc:   PainSc: 5       Patients Stated Pain Goal: 3 (71/25/27 1292)  Complications: No notable events documented.

## 2021-01-08 NOTE — Anesthesia Procedure Notes (Signed)
Procedure Name: LMA Insertion Date/Time: 01/08/2021 9:52 AM Performed by: Reece Agar, CRNA Pre-anesthesia Checklist: Patient identified, Emergency Drugs available, Suction available and Patient being monitored Patient Re-evaluated:Patient Re-evaluated prior to induction Oxygen Delivery Method: Circle System Utilized Preoxygenation: Pre-oxygenation with 100% oxygen Induction Type: IV induction Ventilation: Mask ventilation without difficulty LMA: LMA inserted LMA Size: 4.0 Number of attempts: 1 Airway Equipment and Method: Bite block Placement Confirmation: positive ETCO2 Tube secured with: Tape Dental Injury: Teeth and Oropharynx as per pre-operative assessment

## 2021-01-08 NOTE — Anesthesia Procedure Notes (Signed)
Anesthesia Regional Block: Adductor canal block   Pre-Anesthetic Checklist: , timeout performed,  Correct Patient, Correct Site, Correct Laterality,  Correct Procedure, Correct Position, site marked,  Risks and benefits discussed,  Surgical consent,  Pre-op evaluation,  At surgeon's request and post-op pain management  Laterality: Right  Prep: Maximum Sterile Barrier Precautions used, chloraprep       Needles:  Injection technique: Single-shot  Needle Type: Echogenic Stimulator Needle     Needle Length: 9cm  Needle Gauge: 22     Additional Needles:   Procedures:,,,, ultrasound used (permanent image in chart),,    Narrative:  Start time: 01/08/2021 9:15 AM End time: 01/08/2021 9:20 AM Injection made incrementally with aspirations every 5 mL.  Performed by: Personally  Anesthesiologist: Pervis Hocking, DO  Additional Notes: Monitors applied. No increased pain on injection. No increased resistance to injection. Injection made in 5cc increments. Good needle visualization. Patient tolerated procedure well.

## 2021-01-08 NOTE — Op Note (Signed)
° °  Date of Surgery: 01/08/2021  INDICATIONS: Ms. Carly Armstrong is a 50 y.o.-year-old female with right distal tibia/fibula fracture.  The risk and benefits of the procedure with discussed in detail and documented in the pre-operative evaluation.  PREOPERATIVE DIAGNOSIS: 1. Right closed tibia/fibula fracture  POSTOPERATIVE DIAGNOSIS: Same.  PROCEDURE: 1. Intramedullary nailing of right tibia   SURGEON: Yevonne Pax MD  ASSISTANT: Raynelle Fanning, ATC; necessary for the timely completion of procedure and due to complexity of procedure.  ANESTHESIA:  general  IV FLUIDS AND URINE: See anesthesia record.  ANTIBIOTICS: Ancef 2g  ESTIMATED BLOOD LOSS: 25 mL.  IMPLANTS:  Implant Name Type Inv. Item Serial No. Manufacturer Lot No. LRB No. Used Action  NAIL TIB UNIV Z 8.3X32 - [id number] Nail NAIL TIB UNIV Z 8.3X32  [employer](ORTH,TRAU,BIO,SG) [id number] Right 1 Implanted  SCREW CORT FA 4X40 - [id number] Screw SCREW CORT FA 4X40  [employer](ORTH,TRAU,BIO,SG) [id number] Right 1 Implanted  SCREW CORT IM NAIL 4X30 - [id number] Screw SCREW CORT IM NAIL 4X30  [employer](ORTH,TRAU,BIO,SG) [id number] Right 1 Implanted  SCREW CANC FEM FA 5X30 - [id number] Screw SCREW CANC FEM FA 5X30  [employer](ORTH,TRAU,BIO,SG) [id number] Right 1 Implanted  SCREW BONE 5.0X37.5MM CORT Z - [id number] Screw SCREW BONE 5.0X37.5MM CORT Z  [employer](ORTH,TRAU,BIO,SG) [id number] Right 1 Implanted    DRAINS: None  CULTURES: None  COMPLICATIONS: none  DESCRIPTION OF PROCEDURE:  Was identified in the preoperative holding area.  Correct site was noted and timeout held with anesthesia.  She is subsequently taken back to the operating room.  Anesthesia was induced.  The leg was prepped and draped in the usual sterile fashion with a bump under the hip.  All bony prominences were padded.  Final timeout was performed following this attention was then turned to the knee. Lateral parapatellar approach was utilized.  15 blade was  taken down sharply through the skin.  Dissection was continued down through the first layer of the retinaculum.  We subsequently were able to be extracapsular.  Guidepin was used to localize the starting pin.  This was put into place under AP and lateral fluoroscopy.  We subsequently used the opening reamer.  Ball-tipped guidewire was passed and a series of towel bumps and inline traction were used to obtain an anatomic reduction.  The wire was passed to the level of the screws.  The nail was then measured off of the guidewire and subsequently placed.  2 proximal static screws and 2 static distal screws were then placed.  Distally we used perfect circle technique medial to lateral.    The wounds were thoroughly irrigated closed in layers of 0 Vicryl 2-0 Vicryl and 3-0 nylon.  Soft dressing was applied.  A cam boot walker was applied.   Raynelle Fanning ATC was necessary for opening, closing, retracting, limb positioning and overall facilitation and timely completion of the procedure.   POSTOPERATIVE PLAN: She will be weightbearing as tolerated in a cam boot.  She will be admitted overnight for 24-hour observation.  She will work with PT.  I will see her back in 2 weeks for wound check.  Yevonne Pax, MD 11:57 AM

## 2021-01-08 NOTE — Brief Op Note (Signed)
° °  Brief Op Note  Date of Surgery: 01/08/2021  Preoperative Diagnosis: Right tibia fracture  Postoperative Diagnosis: same  Procedure: Procedure(s): INTRAMEDULLARY (IM) NAIL TIBIAL  Implants: Implant Name Type Inv. Item Serial No. Manufacturer Lot No. LRB No. Used Action  NAIL TIB UNIV Z 8.3X32 - [id number] Nail NAIL TIB UNIV Z 8.3X32  [employer](ORTH,TRAU,BIO,SG) [id number] Right 1 Implanted  SCREW CORT FA 4X40 - [id number] Screw SCREW CORT FA 4X40  [employer](ORTH,TRAU,BIO,SG) [id number] Right 1 Implanted  SCREW CORT IM NAIL 4X30 - [id number] Screw SCREW CORT IM NAIL 4X30  [employer](ORTH,TRAU,BIO,SG) [id number] Right 1 Implanted  SCREW CANC FEM FA 5X30 - [id number] Screw SCREW CANC FEM FA 5X30  [employer](ORTH,TRAU,BIO,SG) [id number] Right 1 Implanted  SCREW BONE 5.0X37.5MM CORT Z - [id number] Screw SCREW BONE 5.0X37.5MM CORT Z  [employer](ORTH,TRAU,BIO,SG) [id number] Right 1 Implanted    Surgeons: Surgeon(s): Vanetta Mulders, MD  Anesthesia: Choice    Estimated Blood Loss: See anesthesia record  Complications: None  Condition to PACU: Stable  Yevonne Pax, MD 01/08/2021 11:57 AM

## 2021-01-09 ENCOUNTER — Telehealth (HOSPITAL_BASED_OUTPATIENT_CLINIC_OR_DEPARTMENT_OTHER): Payer: Self-pay | Admitting: Orthopaedic Surgery

## 2021-01-09 ENCOUNTER — Encounter (HOSPITAL_COMMUNITY): Payer: Self-pay | Admitting: Orthopaedic Surgery

## 2021-01-09 ENCOUNTER — Other Ambulatory Visit (HOSPITAL_COMMUNITY): Payer: Self-pay

## 2021-01-09 ENCOUNTER — Other Ambulatory Visit (HOSPITAL_BASED_OUTPATIENT_CLINIC_OR_DEPARTMENT_OTHER): Payer: Self-pay | Admitting: Orthopaedic Surgery

## 2021-01-09 DIAGNOSIS — S82391A Other fracture of lower end of right tibia, initial encounter for closed fracture: Secondary | ICD-10-CM | POA: Diagnosis not present

## 2021-01-09 LAB — CBC
HCT: 31.6 % — ABNORMAL LOW (ref 36.0–46.0)
Hemoglobin: 10.5 g/dL — ABNORMAL LOW (ref 12.0–15.0)
MCH: 30.9 pg (ref 26.0–34.0)
MCHC: 33.2 g/dL (ref 30.0–36.0)
MCV: 92.9 fL (ref 80.0–100.0)
Platelets: 211 10*3/uL (ref 150–400)
RBC: 3.4 MIL/uL — ABNORMAL LOW (ref 3.87–5.11)
RDW: 12.8 % (ref 11.5–15.5)
WBC: 9.9 10*3/uL (ref 4.0–10.5)
nRBC: 0 % (ref 0.0–0.2)

## 2021-01-09 LAB — BASIC METABOLIC PANEL
Anion gap: 7 (ref 5–15)
BUN: 10 mg/dL (ref 6–20)
CO2: 25 mmol/L (ref 22–32)
Calcium: 8.9 mg/dL (ref 8.9–10.3)
Chloride: 107 mmol/L (ref 98–111)
Creatinine, Ser: 0.65 mg/dL (ref 0.44–1.00)
GFR, Estimated: >60 mL/min (ref >60)
Glucose, Bld: 122 mg/dL — ABNORMAL HIGH (ref 70–99)
Potassium: 4.5 mmol/L (ref 3.5–5.1)
Sodium: 139 mmol/L (ref 135–145)

## 2021-01-09 MED ORDER — ENOXAPARIN SODIUM 40 MG/0.4ML IJ SOSY
40.0000 mg | PREFILLED_SYRINGE | INTRAMUSCULAR | 0 refills | Status: DC
Start: 2021-01-09 — End: 2021-01-09
  Filled 2021-01-09: qty 5.6, 14d supply, fill #0

## 2021-01-09 MED ORDER — OXYCODONE HCL 5 MG PO CAPS: 5.0000 mg | ORAL_CAPSULE | ORAL | 0 refills | Status: DC | PRN

## 2021-01-09 MED ORDER — OXYCODONE HCL 5 MG PO CAPS
5.0000 mg | ORAL_CAPSULE | ORAL | 0 refills | Status: DC | PRN
  Filled 2021-01-09: qty 20, 4d supply, fill #0

## 2021-01-09 MED ORDER — ACETAMINOPHEN 500 MG PO TABS
500.0000 mg | ORAL_TABLET | Freq: Three times a day (TID) | ORAL | 0 refills | Status: DC
  Filled 2021-01-09: qty 30, 10d supply, fill #0

## 2021-01-09 MED ORDER — IBUPROFEN 800 MG PO TABS
800.0000 mg | ORAL_TABLET | Freq: Three times a day (TID) | ORAL | 0 refills | Status: DC
Start: 2021-01-09 — End: 2021-01-09
  Filled 2021-01-09: qty 30, 10d supply, fill #0

## 2021-01-09 MED ORDER — OXYCODONE HCL 5 MG PO TABS
5.0000 mg | ORAL_TABLET | ORAL | 0 refills | Status: DC | PRN
  Filled 2021-01-09: qty 20, 4d supply, fill #0

## 2021-01-09 MED ORDER — ENOXAPARIN SODIUM 40 MG/0.4ML IJ SOSY
40.0000 mg | PREFILLED_SYRINGE | INTRAMUSCULAR | 0 refills | Status: DC
  Filled 2021-01-09: qty 5.6, 14d supply, fill #0

## 2021-01-09 MED ORDER — IBUPROFEN 800 MG PO TABS
800.0000 mg | ORAL_TABLET | Freq: Three times a day (TID) | ORAL | 0 refills | Status: AC
  Filled 2021-01-09: qty 30, 10d supply, fill #0

## 2021-01-09 MED ORDER — ACETAMINOPHEN 500 MG PO TABS
500.0000 mg | ORAL_TABLET | Freq: Three times a day (TID) | ORAL | 0 refills | Status: AC
Start: 2021-01-09 — End: 2021-01-19
  Filled 2021-01-09 (×2): qty 30, 10d supply, fill #0

## 2021-01-09 NOTE — Telephone Encounter (Signed)
[facility] inpatient nurse called and said patient needs Oxycodone Rx sent to transition of care instead of outpatient pharmacy. Also needs outpatient physical therapy ordered

## 2021-01-09 NOTE — Evaluation (Addendum)
Physical Therapy Evaluation Patient Details Name: Carly Armstrong MRN: [id number] DOB: 1970-03-26 Today's Date: 01/09/2021  History of Present Illness  Pt is a 50 y/o female presenting to ED on 12/26 after falling on ice, found to have R closed tibia/fibula fracture. S/p R tibial IM nail on 12/28. PMH includes anemia and anxiety.  Clinical Impression  Patient admitted following above procedure. Patient presents with post op weakness, impaired balance, ROM deficits, and decreased activity tolerance. Patient requires supervision for mobility with use of crutches. Negotiated 1 stairs with crutches and supervision. Patient with intermittent weightbearing through R LE dependent on pain. Educated patient on weightbearing precautions and boot wear, patient verbalized understanding. Instructed patient on ROM and strengthening exercises to perform at discharge. Patient will benefit from skilled PT services during acute stay to address listed deficits. Recommend OPPT at discharge to address strength and ROM deficits.        Recommendations for follow up therapy are one component of a multi-disciplinary discharge planning process, led by the attending physician.  Recommendations may be updated based on patient status, additional functional criteria and insurance authorization.  Follow Up Recommendations Outpatient PT    Assistance Recommended at Discharge Set up Supervision/Assistance  Functional Status Assessment Patient has had a recent decline in their functional status and demonstrates the ability to make significant improvements in function in a reasonable and predictable amount of time.  Equipment Recommendations  None recommended by PT (patient owns necessary DME)    Recommendations for Other Services       Precautions / Restrictions Precautions Precautions: None Restrictions Weight Bearing Restrictions: Yes RLE Weight Bearing: Weight bearing as tolerated Other Position/Activity Restrictions:  WBAT in Boot      Mobility  Bed Mobility Overal bed mobility: Modified Independent             General bed mobility comments: able to sit EOB with minimal use of bedrails for assist    Transfers Overall transfer level: Needs assistance Equipment used: Crutches Transfers: Sit to/from Stand Sit to Stand: Supervision           General transfer comment: supervision for safety    Ambulation/Gait Ambulation/Gait assistance: Supervision Gait Distance (Feet): 150 Feet Assistive device: Crutches Gait Pattern/deviations: Step-through pattern;Decreased stride length Gait velocity: decreased     General Gait Details: intermittent WBing through R LE dependent on pain. Supervision for safety.  Stairs Stairs: Yes Stairs assistance: Supervision Stair Management: Forwards;Step to pattern;With crutches Number of Stairs: 1 General stair comments: supervision for safety. Instructed patient on sequencing for stair negotiation with crutches  Wheelchair Mobility    Modified Rankin (Stroke Patients Only)       Balance Overall balance assessment: Mild deficits observed, not formally tested                                           Pertinent Vitals/Pain Pain Assessment: Faces Pain Score: 3  Faces Pain Scale: Hurts a little bit Pain Location: RLE Pain Descriptors / Indicators: Discomfort;Grimacing Pain Intervention(s): Limited activity within patient's tolerance;Monitored during session    Home Living Family/patient expects to be discharged to:: Private residence Living Arrangements: Children Available Help at Discharge: Family Type of Home: House Home Access: Stairs to enter   Technical brewer of Steps: 1   Home Layout: One level Home Equipment: Crutches      Prior Function Prior Level of Function :  Independent/Modified Independent;Driving;Working/employed               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 Patient unable to wiggle toes or feel them at this time. Patient initiating [place] through R LE during mobility RLE Sensation: decreased light touch    Cervical / Trunk Assessment Cervical / Trunk Assessment: Normal  Communication   Communication: No difficulties  Cognition Arousal/Alertness: Awake/alert Behavior During Therapy: WFL for tasks assessed/performed Overall Cognitive Status: Within Functional Limits for tasks assessed                                          General Comments General comments (skin integrity, edema, etc.): Pt expressed concern for running in the future, recommended pt follow up with doctor for recovery timeline    Exercises     Assessment/Plan    PT Assessment Patient needs continued PT services  PT Problem List Decreased strength;Decreased balance;Decreased mobility;Decreased range of motion;Decreased activity tolerance;Decreased knowledge of use of DME       PT Treatment Interventions DME instruction;Gait training;Functional mobility training;Stair training;Therapeutic activities;Therapeutic exercise;Balance training;Patient/family education    PT Goals (Current goals can be found in the Care Plan section)  Acute Rehab PT Goals Patient Stated Goal: to reduce pain and go home PT Goal Formulation: With patient Time For Goal Achievement: 01/23/21 Potential to Achieve Goals: Good    Frequency Min 5X/week   Barriers to discharge        Co-evaluation               AM-PAC PT "6 Clicks" Mobility  Outcome Measure Help needed turning from your back to your side while in a flat bed without using bedrails?: None Help needed moving from lying on your back to sitting on the side  of a flat bed without using bedrails?: None Help needed moving to and from a bed to a chair (including a wheelchair)?: A Little Help needed standing up from a chair using your arms (e.g., wheelchair or bedside chair)?: A Little Help needed to walk in hospital room?: A Little Help needed climbing 3-5 steps with a railing? : A Little 6 Click Score: 20    End of Session   Activity Tolerance: Patient tolerated treatment well Patient left: in chair;with call bell/phone within reach;with nursing/sitter in room;with family/visitor present Nurse Communication: Mobility status PT Visit Diagnosis: Muscle weakness (generalized) (M62.81)    Time: 5697-9480 PT Time Calculation (min) (ACUTE ONLY): 20 min   Charges:   PT Evaluation $PT Eval Low Complexity: 1 Low          Muhammed Teutsch A. Gilford Rile PT, DPT Acute Rehabilitation Services Pager [phone] Office [phone]   Linna Hoff 01/09/2021, 9:07 AM

## 2021-01-09 NOTE — Progress Notes (Signed)
Offered pain medicine several times at night and this morning, Patient said, she is not in pain at all and refused her schedule tylenol.

## 2021-01-09 NOTE — TOC Initial Note (Signed)
Transition of Care [facility]) - Initial/Assessment Note    Patient Details  Name: Carly Armstrong MRN: [id number] Date of Birth: [birth year]  [facility] CM/SW Contact:    Marilu Favre, RN Phone Number: 01/09/2021, 11:16 AM  Clinical Narrative:                 PT recommended OP PT, patient agreeable. Offered different locations. Patient prefers [employer]. Spoke to [place] at Dr Sammuel Hines office and was instructed to go ahead and arrange. Order in [place] for MD to sign. OP PT on [employer] information placed on AVS. NCM instructed patient if they do not call her in 2 days for her to call them. Patient voiced understanding.   OT recommending 3 in1 patient in agreement. 3 in1 ordered with Freda Munro with [facility] and will be delivered to patient's room prior to discharge.   Expected Discharge Plan: Home/Self Care Barriers to Discharge: No Barriers Identified   Patient Goals and CMS Choice Patient states their goals for this hospitalization and ongoing recovery are:: to return to home CMS Medicare.gov Compare Post Acute Care list provided to:: Patient Choice offered to / list presented to : Patient  Expected Discharge Plan and Services Expected Discharge Plan: Home/Self Care   Discharge Planning Services: CM Consult Post Acute Care Choice:  (OP PT) Living arrangements for the past 2 months: Single Family Home Expected Discharge Date: 01/09/21               DME Arranged: 3-N-1 DME Agency: [employer] Date DME Agency Contacted: 01/09/21 Time DME Agency Contacted: [phone] Representative spoke with at DME Agency: [place] Arranged: NA          Prior Living Arrangements/Services Living arrangements for the past 2 months: [place] Lives with:: Relatives Patient language and need for interpreter reviewed:: Yes Do you feel safe going back to the place where you live?: Yes      Need for Family Participation in Patient Care: Yes (Comment) Care giver support  system in place?: Yes (comment) Current home services: DME Criminal Activity/Legal Involvement Pertinent to Current Situation/Hospitalization: No - Comment as needed  Activities of Daily Living      Permission Sought/Granted   Permission granted to share information with : No              Emotional Assessment Appearance:: Appears stated age Attitude/Demeanor/Rapport: Engaged Affect (typically observed): Accepting Orientation: : Oriented to Self, Oriented to Place, Oriented to  Time, Oriented to Situation Alcohol / Substance Use: Not Applicable Psych Involvement: No (comment)  Admission diagnosis:  Right tibial fracture [S82.201A] Patient Active Problem List   Diagnosis Date Noted   Right tibial fracture 01/08/2021   Anxiety 03/01/2018   PCP:  Bernerd Limbo, MD Pharmacy:   [id number] - [place], [place] [address] [place] [address] Phone: [phone] Fax: [phone]  [facility] [address] [address] Phone: [phone] Fax: [phone]     Social Determinants of Health (SDOH) Interventions    Readmission Risk Interventions No flowsheet data found.

## 2021-01-09 NOTE — Evaluation (Signed)
Occupational Therapy Evaluation Patient Details Name: TONIE ELSEY MRN: [id number] DOB: 06/13/1970 Today's Date: 01/09/2021   History of Present Illness Pt is a 50 y/o female presenting to ED on 12/26 after falling on ice, found to have R closed tibia/fibula fracture. S/p R tibial IM nail on 12/28. PMH includes anemia and anxiety.   Clinical Impression   Pt independent with ADLs and functional mobility at baseline, has children at home for assistance at d/c. Pt currently supervision-min A for ADLs, Mod I for bed mobility, and supervision for transfers. Educated pt on WB precautions and strategies for tub transfers at home. Pt verbalized understanding, able to adhere to precautions throughout session in boot. Pt presenting with impairments listed below, will continue to follow in the acute setting to maximize safety and independence with ADLs and functional mobility.     Recommendations for follow up therapy are one component of a multi-disciplinary discharge planning process, led by the attending physician.  Recommendations may be updated based on patient status, additional functional criteria and insurance authorization.   Follow Up Recommendations  Follow physician's recommendations for discharge plan and follow up therapies    Assistance Recommended at Discharge Set up Supervision/Assistance  Functional Status Assessment  Patient has had a recent decline in their functional status and demonstrates the ability to make significant improvements in function in a reasonable and predictable amount of time.  Equipment Recommendations  BSC/3in1;Other (comment) (RW)    Recommendations for Other Services PT consult     Precautions / Restrictions Precautions Precautions: None Restrictions Weight Bearing Restrictions: Yes RLE Weight Bearing: Weight bearing as tolerated Other Position/Activity Restrictions: WBAT in Boot      Mobility Bed Mobility Overal bed mobility: Modified  Independent             General bed mobility comments: able to sit EOB with minimal use of bedrails for assist    Transfers Overall transfer level: Needs assistance Equipment used: Rolling walker (2 wheels) Transfers: Sit to/from Stand Sit to Stand: Supervision           General transfer comment: pt stands at EOB with handheld assist, benefits from use of RW for household ambulation      Balance Overall balance assessment: Mild deficits observed, not formally tested                                         ADL either performed or assessed with clinical judgement   ADL Overall ADL's : Needs assistance/impaired Eating/Feeding: Independent;Sitting   Grooming: Min guard;Wash/dry hands;Standing;Sitting   Upper Body Bathing: Supervision/ safety;Sitting   Lower Body Bathing: Minimal assistance;Sitting/lateral leans   Upper Body Dressing : Supervision/safety;Sitting   Lower Body Dressing: Supervision/safety;Sitting/lateral leans Lower Body Dressing Details (indicate cue type and reason): able to simulate donning/doffing LB clothing, able to adjust velcro straps on boot Toilet Transfer: Nature conservation officer;Ambulation Toilet Transfer Details (indicate cue type and reason): reaches for toilet seat to sit/stand from toilet Toileting- Clothing Manipulation and Hygiene: Supervision/safety;Sit to/from stand       Functional mobility during ADLs: Min guard       Vision   Vision Assessment?: No apparent visual deficits     Perception     Praxis      Pertinent Vitals/Pain Pain Assessment: Faces Pain Score: 3  Faces Pain Scale: Hurts a little bit Pain Location: RLE Pain Descriptors / Indicators: Discomfort;Grimacing  Pain Intervention(s): Limited activity within patient's tolerance;Monitored during session;Premedicated before session;Ice applied;Repositioned     Hand Dominance     Extremity/Trunk Assessment Upper Extremity Assessment Upper  Extremity Assessment: Overall WFL for tasks assessed   Lower Extremity Assessment Lower Extremity Assessment: Defer to PT evaluation   Cervical / Trunk Assessment Cervical / Trunk Assessment: Normal   Communication Communication Communication: No difficulties   Cognition Arousal/Alertness: Awake/alert Behavior During Therapy: WFL for tasks assessed/performed Overall Cognitive Status: Within Functional Limits for tasks assessed                                       General Comments  Pt expressed concern for running in the future, recommended pt follow up with doctor for recovery timeline    Exercises     Shoulder Instructions      Home Living Family/patient expects to be discharged to:: Private residence Living Arrangements: Children Available Help at Discharge: Family Type of Home: House Home Access: Stairs to enter Technical brewer of Steps: 1   Home Layout: One level     Bathroom Shower/Tub: Corporate investment banker: Standard Bathroom Accessibility: Yes How Accessible: Other (comment) (via crutches) Home Equipment: Crutches          Prior Functioning/Environment Prior Level of Function : Independent/Modified Independent;Driving;Working/employed               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        OT Problem List: Decreased strength;Decreased range of motion;Decreased activity tolerance;Impaired balance (sitting and/or standing);Decreased knowledge of use of DME or AE      OT Treatment/Interventions: Self-care/ADL training;Therapeutic exercise;DME and/or AE instruction;Balance training;Therapeutic activities    OT Goals(Current goals can be found in the care plan section) Acute Rehab OT Goals Patient Stated Goal: to return to PLOF OT Goal Formulation: With patient Time For Goal Achievement: 01/23/21 Potential to Achieve Goals: Good ADL Goals Pt Will Perform Lower Body Dressing: with set-up;sit to/from  stand Pt Will Transfer to Toilet: with modified independence;regular height toilet;ambulating Pt Will Perform Tub/Shower Transfer: with supervision;ambulating;3 in 1;rolling walker  OT Frequency: Min 2X/week   Barriers to D/C:            Co-evaluation              AM-PAC OT "6 Clicks" Daily Activity     Outcome Measure Help from another person eating meals?: None Help from another person taking care of personal grooming?: None Help from another person toileting, which includes using toliet, bedpan, or urinal?: A Little Help from another person bathing (including washing, rinsing, drying)?: A Little Help from another person to put on and taking off regular upper body clothing?: None Help from another person to put on and taking off regular lower body clothing?: A Little 6 Click Score: 21   End of Session Equipment Utilized During Treatment: Gait belt;Rolling walker (2 wheels) Nurse Communication: Mobility status;Weight bearing status  Activity Tolerance: Patient tolerated treatment well Patient left: in chair;with call bell/phone within reach;with family/visitor present;Other (comment) (no chair alarm needed per RN)  OT Visit Diagnosis: Unsteadiness on feet (R26.81);Other abnormalities of gait and mobility (R26.89);History of falling (Z91.81);Muscle weakness (generalized) (M62.81)                Time: 5400-8676 OT Time Calculation (min): 25 min Charges:  OT General Charges $OT Visit: 1 Visit OT Evaluation $OT  Eval Low Complexity: 1 Low OT Treatments $Self Care/Home Management : 8-22 mins  Lynnda Child, OTD, OTR/L Acute Rehab [phone]) [phone] - [place] 01/09/2021, 8:21 AM

## 2021-01-09 NOTE — Progress Notes (Signed)
° °  Subjective:  Patient reports pain as significant as block as worn off. Sensation is returning in the foot. Compartments remain soft and compressible  Objective:   VITALS:   Vitals:   01/08/21 1508 01/08/21 2014 01/08/21 2354 01/09/21 0414  BP: (!) 110/51 (!) 104/58 (!) 100/48 (!) 110/44  Pulse: 66 67 (!) 57 62  Resp: 16 16 16 16   Temp: 97.6 F (36.4 C) 98.1 F (36.7 C) 97.9 F (36.6 C) 98.3 F (36.8 C)  TempSrc: Oral Oral Oral Oral  SpO2: 99% 98% 100% 100%  Weight:      Height:        Blockade partially in effect. Compartments are all soft and compressible. Minimal pain with passive DF and PF of the foot. Boot in place with ice packs overlying. Toes are all WWP < 2s cap refill.  Lab Results  Component Value Date   WBC 9.9 01/09/2021   HGB 10.5 (L) 01/09/2021   HCT 31.6 (L) 01/09/2021   MCV 92.9 01/09/2021   PLT 211 01/09/2021     Assessment/Plan:  1 Day Post-Op s/p right tibia IMN  - Expected postop acute blood loss anemia - will monitor for symptoms - Patient to work with PT/OT to optimize mobilization safely - DVT ppx - SCDs, ambulation, Lovenox x 2 weeks - Postoperative Abx: Ancef x 2 additional doses given - WBAT operative extremity - Pain control - multimodal pain management, ATC acetaminophen in conjunction with as needed narcotic (oxycodone), although this should be minimized with other modalities - Discharge planning pending CM, appreciate coordination    Chelsea Pedretti 01/09/2021, 7:48 AM

## 2021-01-10 NOTE — Discharge Summary (Signed)
Patient ID: Carly Armstrong MRN: [id number] DOB/AGE: [birth year] 52 y.o.  Admit date: 01/08/2021 Discharge date: 01/09/21  Admission Diagnoses:  Right tibial fracture  Discharge Diagnoses:  Principal Problem:   Right tibial fracture   Past Medical History:  Diagnosis Date   Anemia    Anxiety    Autoimmune disease ([place])    not an issue in years.   Family history of adverse reaction to anesthesia    History of hiatal hernia    Ulcerative colitis ([place])    20's and 30's    Surgeries: Procedure(s): INTRAMEDULLARY (IM) NAIL TIBIAL on 01/08/2021   Consultants (if any): Treatment Team:  Vanetta Mulders, MD  Discharged Condition: Improved  Hospital Course: SUZIE VANDAM is an 50 y.o. female who was admitted 01/08/2021 with a diagnosis of Right tibial fracture and went to the operating room on 01/08/2021 and underwent the above named procedures.    She was given perioperative antibiotics:  Anti-infectives (From admission, onward)    Start     Dose/Rate Route Frequency Ordered Stop   01/08/21 1009  vancomycin (VANCOCIN) powder  Status:  Discontinued          As needed 01/08/21 1009 01/08/21 1156   01/08/21 0700  ceFAZolin (ANCEF) IVPB 2g/100 mL premix        2 g 200 mL/hr over 30 Minutes Intravenous On call to O.R. 01/08/21 5732 01/08/21 1004     .  She was given sequential compression devices, early ambulation, and appropriate chemoprophylaxis for DVT prophylaxis.  She benefited maximally from the hospital stay and there were no complications.    Recent vital signs:  Vitals:   01/09/21 0414 01/09/21 1204  BP: (!) 110/44 (!) 99/50  Pulse: 62 63  Resp: 16 18  Temp: 98.3 F (36.8 C) 98.4 F (36.9 C)  SpO2: 100% 100%    Recent laboratory studies:  Lab Results  Component Value Date   HGB 10.5 (L) 01/09/2021   HGB 12.7 01/08/2021   HGB 14.1 07/10/2020   Lab Results  Component Value Date   WBC 9.9 01/09/2021   PLT 211 01/09/2021   Lab Results   Component Value Date   INR 1.0 07/10/2020   Lab Results  Component Value Date   NA 139 01/09/2021   K 4.5 01/09/2021   CL 107 01/09/2021   CO2 25 01/09/2021   BUN 10 01/09/2021   CREATININE 0.65 01/09/2021   GLUCOSE 122 (H) 01/09/2021    Discharge Medications:   Allergies as of 01/09/2021       Reactions   Asa [aspirin]    Codeine    Red Dye    Tolerated Acetaminophen inpatient 12/28   Tramadol Other (See Comments)        Medication List     STOP taking these medications    oxyCODONE 5 MG immediate release tablet Commonly known as: Roxicodone Replaced by: oxycodone 5 MG capsule       TAKE these medications    acetaminophen 500 MG tablet Commonly known as: TYLENOL Take 1 tablet (500 mg total) by mouth every 8 (eight) hours for 10 days.   busPIRone 15 MG tablet Commonly known as: BUSPAR Take 5 mg by mouth at bedtime.   enoxaparin 40 MG/0.4ML injection Commonly known as: LOVENOX Inject 0.4 mLs (40 mg total) into the skin daily for 14 days.   estradiol 0.1 MG/GM vaginal cream Commonly known as: ESTRACE Place 1 g vaginally at bedtime.   ibuprofen 800  MG tablet Commonly known as: ADVIL Take 1 tablet (800 mg total) by mouth every 8 (eight) hours for 10 days. Please take with food, please alternate with acetaminophen   melatonin 3 MG Tabs tablet Take 3 mg by mouth at bedtime.   oxycodone 5 MG capsule Commonly known as: OXY-IR Take 1 capsule (5 mg total) by mouth every 4 (four) hours as needed (severe pain). Replaces: oxyCODONE 5 MG immediate release tablet   oxyCODONE 5 MG immediate release tablet Commonly known as: Oxy IR/ROXICODONE Take 1 tablet (5 mg total) by mouth every 4 (four) hours as needed (severe pain).   oyster calcium 500 MG Tabs tablet Take 500 mg of elemental calcium by mouth daily.   pantoprazole 20 MG tablet Commonly known as: PROTONIX Take 1 tablet (20 mg total) by mouth daily.   Vitamin D3 Super Strength 50 MCG (2000 UT)  Tabs Generic drug: Cholecalciferol Take 4,000 Units by mouth daily.        Diagnostic Studies: DG Tibia/Fibula Right  Result Date: 01/08/2021 CLINICAL DATA:  Status post ORIF right tibia. EXAM: RIGHT TIBIA AND FIBULA - 2 VIEW COMPARISON:  01/06/2021 FINDINGS: Eleven images obtained in the operating room via C-arm radiography shows open reduction and internal fixation of distal right tibial fracture. Placement of IM nail and screw device is observed. The hardware components and fracture fragments are in anatomic alignment. IMPRESSION: Status post ORIF of distal right tibial fracture. Electronically Signed   By: Kerby Moors M.D.   On: 01/08/2021 13:51   DG Tibia/Fibula Right  Result Date: 01/06/2021 CLINICAL DATA:  Fall. EXAM: RIGHT FOOT COMPLETE - 3+ VIEW; RIGHT ANKLE - COMPLETE 3+ VIEW; RIGHT TIBIA AND FIBULA - 2 VIEW COMPARISON:  None. FINDINGS: There is a mildly displaced spiral fracture of the distal tibial diaphysis with approximately 4 mm anterior and lateral displacement of the distal fracture fragment. Mildly displaced fracture of the proximal fibular diaphysis with approximately 4 mm anterior and lateral displacement of the distal fracture fragment. No other acute fracture. There is no dislocation. The bones are well mineralized. The soft tissues are unremarkable. IMPRESSION: Mildly displaced spiral fractures of the distal tibial and proximal fibular diaphysis. Electronically Signed   By: Anner Crete M.D.   On: 01/06/2021 00:57   DG Ankle Complete Right  Result Date: 01/06/2021 CLINICAL DATA:  Fall. EXAM: RIGHT FOOT COMPLETE - 3+ VIEW; RIGHT ANKLE - COMPLETE 3+ VIEW; RIGHT TIBIA AND FIBULA - 2 VIEW COMPARISON:  None. FINDINGS: There is a mildly displaced spiral fracture of the distal tibial diaphysis with approximately 4 mm anterior and lateral displacement of the distal fracture fragment. Mildly displaced fracture of the proximal fibular diaphysis with approximately 4 mm  anterior and lateral displacement of the distal fracture fragment. No other acute fracture. There is no dislocation. The bones are well mineralized. The soft tissues are unremarkable. IMPRESSION: Mildly displaced spiral fractures of the distal tibial and proximal fibular diaphysis. Electronically Signed   By: Anner Crete M.D.   On: 01/06/2021 00:57   DG Foot Complete Right  Result Date: 01/06/2021 CLINICAL DATA:  Fall. EXAM: RIGHT FOOT COMPLETE - 3+ VIEW; RIGHT ANKLE - COMPLETE 3+ VIEW; RIGHT TIBIA AND FIBULA - 2 VIEW COMPARISON:  None. FINDINGS: There is a mildly displaced spiral fracture of the distal tibial diaphysis with approximately 4 mm anterior and lateral displacement of the distal fracture fragment. Mildly displaced fracture of the proximal fibular diaphysis with approximately 4 mm anterior and lateral displacement of the  distal fracture fragment. No other acute fracture. There is no dislocation. The bones are well mineralized. The soft tissues are unremarkable. IMPRESSION: Mildly displaced spiral fractures of the distal tibial and proximal fibular diaphysis. Electronically Signed   By: Anner Crete M.D.   On: 01/06/2021 00:57   DG C-Arm 1-60 Min-No Report  Result Date: 01/08/2021 Fluoroscopy was utilized by the requesting physician.  No radiographic interpretation.   DG C-Arm 1-60 Min-No Report  Result Date: 01/08/2021 Fluoroscopy was utilized by the requesting physician.  No radiographic interpretation.    Disposition: Discharge disposition: 01-Home or Self Care       Discharge Instructions     Ambulatory referral to Physical Therapy   Complete by: As directed    Iontophoresis - 4 mg/ml of dexamethasone: No   T.E.N.S. Unit Evaluation and Dispense as Indicated: No        Follow-up Information     Vanetta Mulders, MD. Schedule an appointment as soon as possible for a visit.   Specialty: Orthopedic Surgery Contact information: [address] [place] [address] [phone]         [facility] Follow up.   Specialty: Rehabilitation Contact information: [address] [id number] [place] [phone]                 Signed: Vanetta Mulders 01/10/2021, 8:16 AM

## 2021-01-12 ENCOUNTER — Encounter (HOSPITAL_BASED_OUTPATIENT_CLINIC_OR_DEPARTMENT_OTHER): Payer: Self-pay | Admitting: Orthopaedic Surgery

## 2021-01-15 NOTE — Therapy (Signed)
OUTPATIENT PHYSICAL THERAPY LOWER EXTREMITY EVALUATION   Patient Name: Carly Armstrong MRN: [id number] DOB:1971-01-03, 51 y.o., female Today's Date: 01/16/2021   PT End of Session - 01/16/21 1450     Visit Number 1    Number of Visits 17    Date for PT Re-Evaluation 03/13/21    Authorization Type [employer]    Authorization Time Period FOTO v6, v10    PT Start Time 1400    PT Stop Time 1445    PT Time Calculation (min) 45 min    Activity Tolerance Patient tolerated treatment well    Behavior During Therapy WFL for tasks assessed/performed             Past Medical History:  Diagnosis Date   Anemia    Anxiety    Autoimmune disease ([place])    not an issue in years.   Family history of adverse reaction to anesthesia    History of hiatal hernia    Ulcerative colitis ([place])    20's and 30's   Past Surgical History:  Procedure Laterality Date   CESAREAN SECTION     COLONOSCOPY W/ POLYPECTOMY     TIBIA IM NAIL INSERTION Right 01/08/2021   Procedure: INTRAMEDULLARY (IM) NAIL TIBIAL;  Surgeon: Vanetta Mulders, MD;  Location: [place];  Service: Orthopedics;  Laterality: Right;   WISDOM TOOTH EXTRACTION     Patient Active Problem List   Diagnosis Date Noted   Right tibial fracture 01/08/2021   Anxiety 03/01/2018    PCP: Bernerd Limbo, MD  REFERRING PROVIDER: Vanetta Mulders, MD  REFERRING DIAG: S82.201A (ICD-10-CM) - Right tibial fracture  THERAPY DIAG:  Pain in right lower leg  Muscle weakness (generalized)  Difficulty in walking, not elsewhere classified  Unsteadiness on feet  ONSET DATE: 01/05/2021  SUBJECTIVE:   SUBJECTIVE STATEMENT: Pt reports s/p Rt tibia IM nail placement on 01/08/2021 following an incident on 01/05/2021 during which she slipped on ice in her driveway and fell, breaking her tibia and fibula on the Rt. Pt arrives in a CAM boot and was instructed to occasionally weight bear to her tolerance using her axillary crutches and boot. She was  also instructed to begin early ROM exercises in open chain. 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 Pt reports 0/10 pain at rest, although she reports regular pressure at her ankle. Worst pain is 6-8/10. Aggravating factors include weight-bearing, including standing/ walking. Pt reports occasional N/T at the anterior ankle. She continues to have significant post-op swelling. She wishes to return to running for exercises, kickboxing.  Pt denies any unexplained weight change, unrelenting night pain, nausea/ vomiting, changes in bowel/ bladder function. She reports her next orthopedic appointment is scheduled for 01/23/2021.  PERTINENT HISTORY: s/p Rt tibia IM nail placement on 01/08/2021 following Rt tibia/fibula fx on 01/05/2021  PAIN:  Are you having pain? No VAS scale: 0/10 Pain location: Ankle/ lower leg Pain orientation: Right  PAIN TYPE: aching and tight Pain description: intermittent  Aggravating factors: Weight-bearing Relieving factors: Offloading LE, pain medications   WEIGHT BEARING RESTRICTIONS Yes WBAT in CAM boot and Axillary crutches  FALLS:  Has patient fallen in last 6 months? Yes, Number of falls: 1, related to current problem  LIVING ENVIRONMENT: Lives with: lives with their family Lives in: House/apartment Stairs: Yes; External: 1 steps; none Has following equipment at home: Crutches  OCCUPATION: naturopath  PLOF: Independent  PATIENT GOALS Return  to running for exercises, standing at work   OBJECTIVE:   DIAGNOSTIC FINDINGS: 12/27/2020 DG Tibia/ fibula right: IMPRESSION: Mildly displaced spiral fractures of the distal tibial and proximal fibular diaphysis. 01/08/2021 DG Tibia/ fibula right: IMPRESSION: Status post ORIF of distal right tibial fracture.  PATIENT SURVEYS:  FOTO 51%, projected 75% in 14 visits  COGNITION:  Overall cognitive status: Within functional limits for tasks  assessed     PALPATION: TTP to medial lower leg, proximal fibula, anterior ankle  LE AROM:  AROM Right 01/16/2021 Left 01/16/2021  Ankle dorsiflexion -16 14  Ankle plantarflexion 45 55  Ankle inversion 3 18  Ankle eversion 10 26   (Blank rows = not tested)  LE MMT:  MMT Right 01/16/2021 Left 01/16/2021  Hip flexion 5/5 5/5  Hip extension 4/5 4/5  Hip abduction 5/5 5/5  Knee flexion 4+/5 5/5  Knee extension 4+/5 5/5  Ankle dorsiflexion 3+/5p! 5/5  Ankle plantarflexion 4+/5 5/5  Ankle inversion 2+/5 5/5  Ankle eversion 2+/5 5/5   (Blank rows = not tested)  LOWER EXTREMITY   FUNCTIONAL TESTS:  Double leg squat in CAM boot with UE support: 60% loading through LLE, 50% depth, no pain   TODAY'S TREATMENT: [facility] Adult PT Treatment:                                                DATE: 01/17/2020 Therapeutic Exercise: Seated ankle ABCs x1 through the entire alphabet Seated heel/toe rocks with handhold resistance 2x10 Seated active hamstring/ calf stretch x1 minute Manual Therapy: N/A Neuromuscular re-ed: N/A Therapeutic Activity: N/A Modalities: N/A Self Care: N/A    PATIENT EDUCATION:  Education details: Pt educated on prognosis, POC, and HEP. Person educated: Patient Education method: Explanation, handout Education comprehension: verbalized understanding   HOME EXERCISE PROGRAM: Access Code: [email or web address]  Exercises Seated Ankle Alphabet - 2 x daily - 7 x weekly - 3 sets Seated Heel Raise - 2 x daily - 7 x weekly - 3 sets - 10 reps Seated Heel Toe Raises - 2 x daily - 7 x weekly - 3 sets - 10 reps Seated Hamstring Stretch - 2 x daily - 7 x weekly - 2 sets - 1 min hold   ASSESSMENT:  CLINICAL IMPRESSION: Patient is a 51 y.o. F who was seen today for physical therapy evaluation and treatment following Rt tib-fib fx on 01/05/2021 and subsequent tibial IM nail placement on 01/08/2021. Objective impairments include limited and painful global Rt ankle AROM, weak Rt  ankle musculature, weak BIL hip extension strength, and limitations in functional squat. These impairments are limiting patient from participating in running/ working out, as well as standing for her job duties. The pt has a follow-up appointment with her orthopedic surgeon on 01/23/2021; she was instructed to inform the PT of any updates from this visit as it may pertain to her treatment in physical therapy. Patient will benefit from skilled PT to address above impairments and improve overall function.  REHAB POTENTIAL: Good  CLINICAL DECISION MAKING: Stable/uncomplicated  EVALUATION COMPLEXITY: Low   GOALS: Goals reviewed with patient? Yes  SHORT TERM GOALS:  STG Name Target Date Goal status  1 Pt will report understanding and adherence to her HEP in order to promote independence in the management of her primary impairments. Baseline:  02/13/2021 INITIAL  2 Pt will demonstrate ability to tolerate  full-weightbearing forward, backward, and lateral weight shifts while wearing CAM boot in order to progress to walking without a boot. Baseline: Pt unable to weight-bear 50% of her weight through her LLE with CAM boot on 02/13/2021 INITIAL   LONG TERM GOALS:   LTG Name Target Date Goal status  1 Pt will achieve a FOTO score of 75% in order to demonstrate improved functional ability as it relates to her ankle impairments. Baseline: 51% 03/13/2021 INITIAL  2 Pt will demonstrate ability to complete a 6 minute walk test without a seated break and without her CAM boot in order to progress to a walk-to-jog program with less limitation. Baseline: Pt unable to walk without CAM boot/ crutches due to post-op weight-bearing restrictions. 03/13/2021 INITIAL  3 Pt will achieve global Rt ankle AROM within 10 degrees of Lt in order to put on shoes/ socks without limitation. Baseline: See AROM chart 03/13/2021 INITIAL  4 Pt will achieve 20 double-leg heel raises with 0-2/10 pain in order to progress to standing on her  toes when reaching into overhead cabinets with less limitation. Baseline: Unable to assess due to post-op status. 03/13/2021 INITIAL   PLAN: PT FREQUENCY: 2x/week  PT DURATION: 8 weeks  PLANNED INTERVENTIONS: Therapeutic exercises, Therapeutic activity, Neuro Muscular re-education, Balance training, Gait training, Patient/Family education, Joint mobilization, Stair training, Aquatic Therapy, Cryotherapy, Taping, Vasopneumatic device, and Manual therapy  PLAN FOR NEXT SESSION: Progress open-chain ankle AROM/ strengthening; progress to early closed-chain exercises as able.   Vanessa Seneca, PT, DPT 01/16/21 3:04 PM

## 2021-01-16 ENCOUNTER — Other Ambulatory Visit: Payer: Self-pay

## 2021-01-16 ENCOUNTER — Ambulatory Visit: Payer: 59 | Attending: Orthopaedic Surgery

## 2021-01-16 DIAGNOSIS — R262 Difficulty in walking, not elsewhere classified: Secondary | ICD-10-CM | POA: Insufficient documentation

## 2021-01-16 DIAGNOSIS — M6281 Muscle weakness (generalized): Secondary | ICD-10-CM | POA: Diagnosis present

## 2021-01-16 DIAGNOSIS — M79661 Pain in right lower leg: Secondary | ICD-10-CM | POA: Diagnosis not present

## 2021-01-16 DIAGNOSIS — S82201A Unspecified fracture of shaft of right tibia, initial encounter for closed fracture: Secondary | ICD-10-CM | POA: Diagnosis not present

## 2021-01-16 DIAGNOSIS — R2681 Unsteadiness on feet: Secondary | ICD-10-CM | POA: Diagnosis present

## 2021-01-16 NOTE — Patient Instructions (Signed)
Pt instructed to follow weight-bearing restrictions, proper form with HEP.

## 2021-01-17 ENCOUNTER — Telehealth: Payer: Self-pay | Admitting: Orthopaedic Surgery

## 2021-01-17 ENCOUNTER — Other Ambulatory Visit (HOSPITAL_BASED_OUTPATIENT_CLINIC_OR_DEPARTMENT_OTHER): Payer: Self-pay | Admitting: Orthopaedic Surgery

## 2021-01-17 ENCOUNTER — Encounter (HOSPITAL_BASED_OUTPATIENT_CLINIC_OR_DEPARTMENT_OTHER): Payer: Self-pay | Admitting: Orthopaedic Surgery

## 2021-01-17 MED ORDER — OXYCODONE HCL 5 MG PO TABS: 5.0000 mg | ORAL_TABLET | ORAL | 0 refills | Status: DC | PRN

## 2021-01-17 NOTE — Telephone Encounter (Signed)
Pt called requesting a refill of pain medication. Please send to pharmacy on file. Pt phone number is [phone]. Please call pt when sent in

## 2021-01-21 ENCOUNTER — Other Ambulatory Visit: Payer: Self-pay

## 2021-01-21 ENCOUNTER — Ambulatory Visit: Payer: 59

## 2021-01-21 NOTE — Therapy (Addendum)
OUTPATIENT PHYSICAL THERAPY TREATMENT NOTE/ DISCHARGE SUMMARY   Patient Name: Carly Armstrong MRN: [id number] DOB:October 12, 1970, 51 y.o., female Today's Date: 01/21/2021  PCP: Bernerd Limbo, MD REFERRING PROVIDER: Bernerd Limbo, MD    Past Medical History:  Diagnosis Date   Anemia    Anxiety    Autoimmune disease ([place])    not an issue in years.   Family history of adverse reaction to anesthesia    History of hiatal hernia    Ulcerative colitis ([place])    20's and 30's   Past Surgical History:  Procedure Laterality Date   CESAREAN SECTION     COLONOSCOPY W/ POLYPECTOMY     TIBIA IM NAIL INSERTION Right 01/08/2021   Procedure: INTRAMEDULLARY (IM) NAIL TIBIAL;  Surgeon: Vanetta Mulders, MD;  Location: [facility];  Service: Orthopedics;  Laterality: Right;   WISDOM TOOTH EXTRACTION     Patient Active Problem List   Diagnosis Date Noted   Right tibial fracture 01/08/2021   Anxiety 03/01/2018    REFERRING DIAG: ORIF R tibia  THERAPY DIAG:  Pain in right lower leg  Muscle weakness (generalized)  Difficulty in walking, not elsewhere classified  Unsteadiness on feet  PERTINENT HISTORY: see eval  PRECAUTIONS: WBAT in CAM boot  SUBJECTIVE: no changes  PAIN:  Are you having pain? Yes VAS scale: 2/10      OBJECTIVE:    DIAGNOSTIC FINDINGS: 12/27/2020 DG Tibia/ fibula right: IMPRESSION: Mildly displaced spiral fractures of the distal tibial and proximal fibular diaphysis. 01/08/2021 DG Tibia/ fibula right: IMPRESSION: Status post ORIF of distal right tibial fracture.   PATIENT SURVEYS:  FOTO 51%, projected 75% in 14 visits   COGNITION:          Overall cognitive status: Within functional limits for tasks assessed                  PALPATION: TTP to medial lower leg, proximal fibula, anterior ankle   LE AROM:   AROM Right 01/16/2021 Left 01/16/2021  Ankle dorsiflexion -16 14  Ankle plantarflexion 45 55  Ankle inversion 3 18  Ankle eversion 10 26   (Blank rows  = not tested)   LE MMT:   MMT Right 01/16/2021 Left 01/16/2021  Hip flexion 5/5 5/5  Hip extension 4/5 4/5  Hip abduction 5/5 5/5  Knee flexion 4+/5 5/5  Knee extension 4+/5 5/5  Ankle dorsiflexion 3+/5p! 5/5  Ankle plantarflexion 4+/5 5/5  Ankle inversion 2+/5 5/5  Ankle eversion 2+/5 5/5   (Blank rows = not tested)   LOWER EXTREMITY    FUNCTIONAL TESTS:  Double leg squat in CAM boot with UE support: 60% loading through LLE, 50% depth, no pain     TODAY'S TREATMENT: [facility] Adult PT Treatment:                                                DATE: 01/21/21 Therapeutic Exercise: HEP review with addition of heel slides and plantar fascia stretch  [facility] Adult PT Treatment:                                                DATE: 01/17/2020 Therapeutic Exercise: Seated ankle ABCs x1 through the entire alphabet Seated heel/toe rocks  with handhold resistance 2x10 Seated active hamstring/ calf stretch x1 minute Manual Therapy: N/A Neuromuscular re-ed: N/A Therapeutic Activity: N/A Modalities: N/A Self Care: N/A      PATIENT EDUCATION:  Education details: Pt educated on prognosis, POC, and HEP. Person educated: Patient Education method: Explanation, handout Education comprehension: verbalized understanding     HOME EXERCISE PROGRAM: Access Code: [email or web address] URL: [email or web address] Date: 01/21/2021 Prepared by: Gustavus Bryant  Exercises Seated Ankle Alphabet - 2 x daily - 7 x weekly - 3 sets Seated Heel Raise - 2 x daily - 7 x weekly - 3 sets - 10 reps Seated Heel Toe Raises - 2 x daily - 7 x weekly - 3 sets - 10 reps Seated Hamstring Stretch - 2 x daily - 7 x weekly - 2 sets - 1 min hold Long Sitting Plantar Fascia Stretch with Towel - 2 x daily - 7 x weekly - 1 sets - 3 reps - 30s hold Seated Heel Slide - 2 x daily - 7 x weekly - 2 sets - 15 reps - 30s hold      ASSESSMENT:   CLINICAL IMPRESSION: Patient arrives for session but requests to limit session  due to financial reasons.  HEP reviewed and patient demonstrating good form and knowledge of tasks.  Will see Ortho on 01/23/21 and discuss independent management under S of her friend who is a PT    REHAB POTENTIAL: Good   CLINICAL DECISION MAKING: Stable/uncomplicated   EVALUATION COMPLEXITY: Low     GOALS: Goals reviewed with patient? Yes   SHORT TERM GOALS:   STG Name Target Date Goal status  1 Pt will report understanding and adherence to her HEP in order to promote independence in the management of her primary impairments. Baseline:  02/13/2021 INITIAL  2 Pt will demonstrate ability to tolerate full-weightbearing forward, backward, and lateral weight shifts while wearing CAM boot in order to progress to walking without a boot. Baseline: Pt unable to weight-bear 50% of her weight through her LLE with CAM boot on 02/13/2021 INITIAL    LONG TERM GOALS:    LTG Name Target Date Goal status  1 Pt will achieve a FOTO score of 75% in order to demonstrate improved functional ability as it relates to her ankle impairments. Baseline: 51% 03/13/2021 INITIAL  2 Pt will demonstrate ability to complete a 6 minute walk test without a seated break and without her CAM boot in order to progress to a walk-to-jog program with less limitation. Baseline: Pt unable to walk without CAM boot/ crutches due to post-op weight-bearing restrictions. 03/13/2021 INITIAL  3 Pt will achieve global Rt ankle AROM within 10 degrees of Lt in order to put on shoes/ socks without limitation. Baseline: See AROM chart 03/13/2021 INITIAL  4 Pt will achieve 20 double-leg heel raises with 0-2/10 pain in order to progress to standing on her toes when reaching into overhead cabinets with less limitation. Baseline: Unable to assess due to post-op status. 03/13/2021 INITIAL    PLAN: PT FREQUENCY: 2x/week   PT DURATION: 8 weeks   PLANNED INTERVENTIONS: Therapeutic exercises, Therapeutic activity, Neuro Muscular re-education, Balance  training, Gait training, Patient/Family education, Joint mobilization, Stair training, Aquatic Therapy, Cryotherapy, Taping, Vasopneumatic device, and Manual therapy   PLAN FOR NEXT SESSION: Follow up with MD appointment and appropriateness of managing independently under S of PT assosciate    Hildred Laser PT 01/21/2021, 5:36 PM  PHYSICAL THERAPY DISCHARGE SUMMARY  Visits from Start of Care: 2  Current functional level related to goals / functional outcomes: Unable to assess   Remaining deficits: Unable to assess   Education / Equipment: HEP   Patient agrees to discharge. Patient goals were not met. Patient is being discharged due to not returning since the last visit.  Vanessa Lonoke, PT, DPT 03/13/21 2:41 PM

## 2021-01-22 NOTE — Therapy (Incomplete)
OUTPATIENT PHYSICAL THERAPY TREATMENT NOTE   Patient Name: Carly Armstrong MRN: [id number] DOB:11-23-1970, 51 y.o., female Today's Date: 01/21/2021  PCP: Bernerd Limbo, MD REFERRING PROVIDER: Bernerd Limbo, MD    Past Medical History:  Diagnosis Date   Anemia    Anxiety    Autoimmune disease ([place])    not an issue in years.   Family history of adverse reaction to anesthesia    History of hiatal hernia    Ulcerative colitis ([place])    20's and 30's   Past Surgical History:  Procedure Laterality Date   CESAREAN SECTION     COLONOSCOPY W/ POLYPECTOMY     TIBIA IM NAIL INSERTION Right 01/08/2021   Procedure: INTRAMEDULLARY (IM) NAIL TIBIAL;  Surgeon: Vanetta Mulders, MD;  Location: [place];  Service: Orthopedics;  Laterality: Right;   WISDOM TOOTH EXTRACTION     Patient Active Problem List   Diagnosis Date Noted   Right tibial fracture 01/08/2021   Anxiety 03/01/2018    REFERRING DIAG: ORIF R tibia  THERAPY DIAG:  Pain in right lower leg  Muscle weakness (generalized)  Difficulty in walking, not elsewhere classified  Unsteadiness on feet  PERTINENT HISTORY: see eval  PRECAUTIONS: WBAT in CAM boot  SUBJECTIVE: ***  PAIN:  Are you having pain? Yes VAS scale: 2/10    OBJECTIVE:  *Unless otherwise noted, objective information collected previously*   DIAGNOSTIC FINDINGS: 12/27/2020 DG Tibia/ fibula right: IMPRESSION: Mildly displaced spiral fractures of the distal tibial and proximal fibular diaphysis. 01/08/2021 DG Tibia/ fibula right: IMPRESSION: Status post ORIF of distal right tibial fracture.   PATIENT SURVEYS:  FOTO 51%, projected 75% in 14 visits   COGNITION:          Overall cognitive status: Within functional limits for tasks assessed                  PALPATION: TTP to medial lower leg, proximal fibula, anterior ankle   LE AROM:   AROM Right 01/16/2021 Left 01/16/2021  Ankle dorsiflexion -16 14  Ankle plantarflexion 45 55  Ankle inversion 3  18  Ankle eversion 10 26   (Blank rows = not tested)   LE MMT:   MMT Right 01/16/2021 Left 01/16/2021  Hip flexion 5/5 5/5  Hip extension 4/5 4/5  Hip abduction 5/5 5/5  Knee flexion 4+/5 5/5  Knee extension 4+/5 5/5  Ankle dorsiflexion 3+/5p! 5/5  Ankle plantarflexion 4+/5 5/5  Ankle inversion 2+/5 5/5  Ankle eversion 2+/5 5/5   (Blank rows = not tested)   LOWER EXTREMITY    FUNCTIONAL TESTS:  Double leg squat in CAM boot with UE support: 60% loading through LLE, 50% depth, no pain     TODAY'S TREATMENT:  [facility] Adult PT Treatment:                                                DATE: 01/22/2021 Therapeutic Exercise: *** Manual Therapy: *** Neuromuscular re-ed: *** Therapeutic Activity: *** Modalities: *** Self Care: [facility] Adult PT Treatment:                                                DATE: 01/21/21 Therapeutic Exercise: HEP review with  addition of heel slides and plantar fascia stretch  [facility] Adult PT Treatment:                                                DATE: 01/17/2020 Therapeutic Exercise: Seated ankle ABCs x1 through the entire alphabet Seated heel/toe rocks with handhold resistance 2x10 Seated active hamstring/ calf stretch x1 minute Manual Therapy: N/A Neuromuscular re-ed: N/A Therapeutic Activity: N/A Modalities: N/A Self Care: N/A      PATIENT EDUCATION:  Education details: Pt educated on prognosis, POC, and HEP. Person educated: Patient Education method: Explanation, handout Education comprehension: verbalized understanding     HOME EXERCISE PROGRAM: Access Code: [email or web address] URL: [email or web address] Date: 01/21/2021 Prepared by: Sharlynn Oliphant  Exercises Seated Ankle Alphabet - 2 x daily - 7 x weekly - 3 sets Seated Heel Raise - 2 x daily - 7 x weekly - 3 sets - 10 reps Seated Heel Toe Raises - 2 x daily - 7 x weekly - 3 sets - 10 reps Seated Hamstring Stretch - 2 x daily - 7 x weekly - 2 sets - 1 min hold Long  Sitting Plantar Fascia Stretch with Towel - 2 x daily - 7 x weekly - 1 sets - 3 reps - 30s hold Seated Heel Slide - 2 x daily - 7 x weekly - 2 sets - 15 reps - 30s hold      ASSESSMENT:   CLINICAL IMPRESSION: ***   REHAB POTENTIAL: Good   CLINICAL DECISION MAKING: Stable/uncomplicated   EVALUATION COMPLEXITY: Low     GOALS: Goals reviewed with patient? Yes   SHORT TERM GOALS:   STG Name Target Date Goal status  1 Pt will report understanding and adherence to her HEP in order to promote independence in the management of her primary impairments. Baseline:  02/13/2021 INITIAL  2 Pt will demonstrate ability to tolerate full-weightbearing forward, backward, and lateral weight shifts while wearing CAM boot in order to progress to walking without a boot. Baseline: Pt unable to weight-bear 50% of her weight through her LLE with CAM boot on 02/13/2021 INITIAL    LONG TERM GOALS:    LTG Name Target Date Goal status  1 Pt will achieve a FOTO score of 75% in order to demonstrate improved functional ability as it relates to her ankle impairments. Baseline: 51% 03/13/2021 INITIAL  2 Pt will demonstrate ability to complete a 6 minute walk test without a seated break and without her CAM boot in order to progress to a walk-to-jog program with less limitation. Baseline: Pt unable to walk without CAM boot/ crutches due to post-op weight-bearing restrictions. 03/13/2021 INITIAL  3 Pt will achieve global Rt ankle AROM within 10 degrees of Lt in order to put on shoes/ socks without limitation. Baseline: See AROM chart 03/13/2021 INITIAL  4 Pt will achieve 20 double-leg heel raises with 0-2/10 pain in order to progress to standing on her toes when reaching into overhead cabinets with less limitation. Baseline: Unable to assess due to post-op status. 03/13/2021 INITIAL    PLAN: PT FREQUENCY: 2x/week   PT DURATION: 8 weeks   PLANNED INTERVENTIONS: Therapeutic exercises, Therapeutic activity, Neuro Muscular  re-education, Balance training, Gait training, Patient/Family education, Joint mobilization, Stair training, Aquatic Therapy, Cryotherapy, Taping, Vasopneumatic device, and Manual therapy   PLAN FOR NEXT SESSION: Follow up with MD appointment and appropriateness  of managing independently under S of PT assosciate    Vanessa La Plant, PT, DPT 01/22/21 1:28 PM

## 2021-01-23 ENCOUNTER — Other Ambulatory Visit (HOSPITAL_BASED_OUTPATIENT_CLINIC_OR_DEPARTMENT_OTHER): Payer: Self-pay | Admitting: Orthopaedic Surgery

## 2021-01-23 ENCOUNTER — Ambulatory Visit (INDEPENDENT_AMBULATORY_CARE_PROVIDER_SITE_OTHER): Payer: 59 | Admitting: Orthopaedic Surgery

## 2021-01-23 ENCOUNTER — Ambulatory Visit: Payer: 59

## 2021-01-23 ENCOUNTER — Ambulatory Visit (HOSPITAL_BASED_OUTPATIENT_CLINIC_OR_DEPARTMENT_OTHER)
Admission: RE | Admit: 2021-01-23 | Discharge: 2021-01-23 | Disposition: A | Discharge status: Home / Self Care | Payer: 59 | Source: Ambulatory Visit | Attending: Orthopaedic Surgery | Admitting: Orthopaedic Surgery

## 2021-01-23 ENCOUNTER — Other Ambulatory Visit: Payer: Self-pay

## 2021-01-23 DIAGNOSIS — S82391D Other fracture of lower end of right tibia, subsequent encounter for closed fracture with routine healing: Secondary | ICD-10-CM

## 2021-01-23 DIAGNOSIS — S82391A Other fracture of lower end of right tibia, initial encounter for closed fracture: Secondary | ICD-10-CM

## 2021-01-23 NOTE — Progress Notes (Signed)
Post Operative Evaluation    Procedure/Date of Surgery: Right tibial IMN 01/08/21  Interval History:   Presents 2-week status post intramedullary nail of the right tibia.  Overall she is doing extremely well.  She has been trying to put weight on the leg which is going well.  She has been using crutches.  She has been in a cam boot.  Pain is overall been very well controlled.  She has been compliant with aspirin usage.   PMH/PSH/Family History/Social History/Meds/Allergies:    Past Medical History:  Diagnosis Date   Anemia    Anxiety    Autoimmune disease ([place])    not an issue in years.   Family history of adverse reaction to anesthesia    History of hiatal hernia    Ulcerative colitis ([place])    20's and 30's   Past Surgical History:  Procedure Laterality Date   CESAREAN SECTION     COLONOSCOPY W/ POLYPECTOMY     TIBIA IM NAIL INSERTION Right 01/08/2021   Procedure: INTRAMEDULLARY (IM) NAIL TIBIAL;  Surgeon: Vanetta Mulders, MD;  Location: [place];  Service: Orthopedics;  Laterality: Right;   WISDOM TOOTH EXTRACTION     Social History   Socioeconomic History   Marital status: Married    Spouse name: Not on file   Number of children: Not on file   Years of education: Not on file   Highest education level: Not on file  Occupational History   Not on file  Tobacco Use   Smoking status: Never   Smokeless tobacco: Never  Vaping Use   Vaping Use: Never used  Substance and Sexual Activity   Alcohol use: Not Currently    Comment: 2 wine- 2 times a month   Drug use: Yes    Frequency: 7.0 times per week    Types: Marijuana   Sexual activity: Not on file  Other Topics Concern   Not on file  Social History Narrative   Not on file   Social Determinants of Health   Financial Resource Strain: Not on file  Food Insecurity: Not on file  Transportation Needs: Not on file  Physical Activity: Not on file  Stress: Not on file  Social  Connections: Not on file   No family history on file. Allergies  Allergen Reactions   Asa [Aspirin]    Codeine    Red Dye     Tolerated Acetaminophen inpatient 12/28   Tramadol Other (See Comments)   Current Outpatient Medications  Medication Sig Dispense Refill   busPIRone (BUSPAR) 15 MG tablet Take 5 mg by mouth at bedtime.     Cholecalciferol (VITAMIN D3 SUPER STRENGTH) 50 MCG (2000 UT) TABS Take 4,000 Units by mouth daily.     enoxaparin (LOVENOX) 40 MG/0.4ML injection Inject 0.4 mLs (40 mg total) into the skin daily for 14 days. 5.6 mL 0   estradiol (ESTRACE) 0.1 MG/GM vaginal cream Place 1 g vaginally at bedtime.     melatonin 3 MG TABS tablet Take 3 mg by mouth at bedtime.     oxyCODONE (OXY IR/ROXICODONE) 5 MG immediate release tablet Take 1 tablet (5 mg total) by mouth every 4 (four) hours as needed (severe pain). 30 tablet 0   oxycodone (OXY-IR) 5 MG capsule Take 1 capsule (5 mg total) by mouth every  4 (four) hours as needed (severe pain). 20 capsule 0   Oyster Shell (OYSTER CALCIUM) 500 MG TABS tablet Take 500 mg of elemental calcium by mouth daily.     pantoprazole (PROTONIX) 20 MG tablet Take 1 tablet (20 mg total) by mouth daily. (Patient not taking: Reported on 01/06/2021) 30 tablet 0   No current facility-administered medications for this visit.   No results found.  Review of Systems:   A ROS was performed including pertinent positives and negatives as documented in the HPI.   Musculoskeletal Exam:    Last menstrual period 09/06/2011.  There is good healing of her proximal and distal incisions.  No surrounding erythema or drainage.  There is expected bruising around the tibia.  Compartments are all soft.  She is able to dorsiflex at the right ankle without difficulty.  Fires EHL.  Sensation is intactin the right lower extremity.  Imaging:    Right tib-fib 2 views x-ray: Status post intramedullary nail fixation without evidence of hardware complication  I  personally reviewed and interpreted the radiographs.   Assessment:   51 year old female 2-week status post intramedullary nail tibia, overall doing extremely well.  She will be activity as tolerated.  She will do physical therapy closer to her home.  She may place weight as tolerated as well as range of motion about the knee and the ankle  Plan :    -She will follow-up in 4 weeks     I personally saw and evaluated the patient, and participated in the management and treatment plan.  Vanetta Mulders, MD Attending Physician, Orthopedic Surgery  This document was dictated using Dragon voice recognition software. A reasonable attempt at proof reading has been made to minimize errors.

## 2021-01-28 ENCOUNTER — Ambulatory Visit: Payer: 59

## 2021-01-30 ENCOUNTER — Other Ambulatory Visit (HOSPITAL_BASED_OUTPATIENT_CLINIC_OR_DEPARTMENT_OTHER): Payer: Self-pay | Admitting: Orthopaedic Surgery

## 2021-01-30 ENCOUNTER — Encounter (HOSPITAL_BASED_OUTPATIENT_CLINIC_OR_DEPARTMENT_OTHER): Payer: Self-pay | Admitting: Orthopaedic Surgery

## 2021-01-30 ENCOUNTER — Ambulatory Visit: Payer: 59

## 2021-01-30 MED ORDER — OXYCODONE HCL 5 MG PO CAPS: 5.0000 mg | ORAL_CAPSULE | ORAL | 0 refills | Status: DC | PRN

## 2021-02-04 ENCOUNTER — Ambulatory Visit: Payer: 59

## 2021-02-06 ENCOUNTER — Ambulatory Visit: Payer: 59

## 2021-02-11 ENCOUNTER — Encounter (HOSPITAL_BASED_OUTPATIENT_CLINIC_OR_DEPARTMENT_OTHER): Payer: Self-pay | Admitting: Orthopaedic Surgery

## 2021-02-13 ENCOUNTER — Ambulatory Visit: Payer: 59

## 2021-02-20 ENCOUNTER — Other Ambulatory Visit (HOSPITAL_BASED_OUTPATIENT_CLINIC_OR_DEPARTMENT_OTHER): Payer: Self-pay | Admitting: Orthopaedic Surgery

## 2021-02-20 ENCOUNTER — Other Ambulatory Visit: Payer: Self-pay

## 2021-02-20 ENCOUNTER — Ambulatory Visit (INDEPENDENT_AMBULATORY_CARE_PROVIDER_SITE_OTHER): Payer: 59 | Admitting: Orthopaedic Surgery

## 2021-02-20 ENCOUNTER — Ambulatory Visit (HOSPITAL_BASED_OUTPATIENT_CLINIC_OR_DEPARTMENT_OTHER)
Admission: RE | Admit: 2021-02-20 | Discharge: 2021-02-20 | Disposition: A | Discharge status: Home / Self Care | Payer: 59 | Source: Ambulatory Visit | Attending: Orthopaedic Surgery | Admitting: Orthopaedic Surgery

## 2021-02-20 DIAGNOSIS — S82391D Other fracture of lower end of right tibia, subsequent encounter for closed fracture with routine healing: Secondary | ICD-10-CM

## 2021-02-20 DIAGNOSIS — S82391A Other fracture of lower end of right tibia, initial encounter for closed fracture: Secondary | ICD-10-CM

## 2021-02-20 NOTE — Progress Notes (Signed)
Post Operative Evaluation    Procedure/Date of Surgery: Right tibial IMN 01/08/21  Interval History:   Presents 6 weeks status post the above procedure.  She has been able to walk with only a cane.  This does cause pain and swelling.  She does continue to have some paresthesias in the right foot as well as swelling.  She is back to work.   PMH/PSH/Family History/Social History/Meds/Allergies:    Past Medical History:  Diagnosis Date   Anemia    Anxiety    Autoimmune disease ([place])    not an issue in years.   Family history of adverse reaction to anesthesia    History of hiatal hernia    Ulcerative colitis ([place])    20's and 30's   Past Surgical History:  Procedure Laterality Date   CESAREAN SECTION     COLONOSCOPY W/ POLYPECTOMY     TIBIA IM NAIL INSERTION Right 01/08/2021   Procedure: INTRAMEDULLARY (IM) NAIL TIBIAL;  Surgeon: Vanetta Mulders, MD;  Location: [place];  Service: Orthopedics;  Laterality: Right;   WISDOM TOOTH EXTRACTION     Social History   Socioeconomic History   Marital status: Married    Spouse name: Not on file   Number of children: Not on file   Years of education: Not on file   Highest education level: Not on file  Occupational History   Not on file  Tobacco Use   Smoking status: Never   Smokeless tobacco: Never  Vaping Use   Vaping Use: Never used  Substance and Sexual Activity   Alcohol use: Not Currently    Comment: 2 wine- 2 times a month   Drug use: Yes    Frequency: 7.0 times per week    Types: Marijuana   Sexual activity: Not on file  Other Topics Concern   Not on file  Social History Narrative   Not on file   Social Determinants of Health   Financial Resource Strain: Not on file  Food Insecurity: Not on file  Transportation Needs: Not on file  Physical Activity: Not on file  Stress: Not on file  Social Connections: Not on file   No family history on file. Allergies  Allergen Reactions    Asa [Aspirin]    Codeine    Red Dye     Tolerated Acetaminophen inpatient 12/28   Tramadol Other (See Comments)   Current Outpatient Medications  Medication Sig Dispense Refill   busPIRone (BUSPAR) 15 MG tablet Take 5 mg by mouth at bedtime.     Cholecalciferol (VITAMIN D3 SUPER STRENGTH) 50 MCG (2000 UT) TABS Take 4,000 Units by mouth daily.     enoxaparin (LOVENOX) 40 MG/0.4ML injection Inject 0.4 mLs (40 mg total) into the skin daily for 14 days. 5.6 mL 0   estradiol (ESTRACE) 0.1 MG/GM vaginal cream Place 1 g vaginally at bedtime.     melatonin 3 MG TABS tablet Take 3 mg by mouth at bedtime.     oxyCODONE (OXY IR/ROXICODONE) 5 MG immediate release tablet Take 1 tablet (5 mg total) by mouth every 4 (four) hours as needed (severe pain). 30 tablet 0   oxycodone (OXY-IR) 5 MG capsule Take 1 capsule (5 mg total) by mouth every 4 (four) hours as needed (severe pain). 20 capsule 0   oxycodone (OXY-IR)  5 MG capsule Take 1 capsule (5 mg total) by mouth every 4 (four) hours as needed (severe pain). 20 capsule 0   Oyster Shell (OYSTER CALCIUM) 500 MG TABS tablet Take 500 mg of elemental calcium by mouth daily.     pantoprazole (PROTONIX) 20 MG tablet Take 1 tablet (20 mg total) by mouth daily. (Patient not taking: Reported on 01/06/2021) 30 tablet 0   No current facility-administered medications for this visit.   No results found.  Review of Systems:   A ROS was performed including pertinent positives and negatives as documented in the HPI.   Musculoskeletal Exam:    Last menstrual period 09/06/2011.  There is good healing of her proximal and distal incisions.  No surrounding erythema or drainage.  There is expected bruising around the tibia.  Compartments are all soft.  She is able to dorsiflex at the right ankle without difficulty.  There is weakness at the EHL tendon.  Sensation is intactin the right lower extremity.  Imaging:    Right tib-fib 2 views x-ray: Status post  intramedullary nail fixation without evidence of hardware complication.  There is interval healing  I personally reviewed and interpreted the radiographs.   Assessment:   51 year old female 6 weeks status post tibial intramedullary nail.  I have advised that her EHL weakness and continued swelling and pain are consistent with ongoing fracture healing.  She will continue to weight-bear as tolerated in order to promote fracture healing.  I will see her back in 5 weeks for recheck  Plan :    -She will follow-up in 5 weeks     I personally saw and evaluated the patient, and participated in the management and treatment plan.  Vanetta Mulders, MD Attending Physician, Orthopedic Surgery  This document was dictated using Dragon voice recognition software. A reasonable attempt at proof reading has been made to minimize errors.

## 2021-04-17 ENCOUNTER — Other Ambulatory Visit (HOSPITAL_BASED_OUTPATIENT_CLINIC_OR_DEPARTMENT_OTHER): Payer: Self-pay | Admitting: Orthopaedic Surgery

## 2021-04-17 ENCOUNTER — Ambulatory Visit (HOSPITAL_BASED_OUTPATIENT_CLINIC_OR_DEPARTMENT_OTHER)
Admission: RE | Admit: 2021-04-17 | Discharge: 2021-04-17 | Disposition: A | Discharge status: Home / Self Care | Payer: 59 | Source: Ambulatory Visit | Attending: Orthopaedic Surgery | Admitting: Orthopaedic Surgery

## 2021-04-17 ENCOUNTER — Ambulatory Visit (INDEPENDENT_AMBULATORY_CARE_PROVIDER_SITE_OTHER): Payer: 59 | Admitting: Orthopaedic Surgery

## 2021-04-17 DIAGNOSIS — S82391A Other fracture of lower end of right tibia, initial encounter for closed fracture: Secondary | ICD-10-CM | POA: Diagnosis not present

## 2021-04-17 DIAGNOSIS — S82391D Other fracture of lower end of right tibia, subsequent encounter for closed fracture with routine healing: Secondary | ICD-10-CM | POA: Insufficient documentation

## 2021-04-17 NOTE — Progress Notes (Signed)
? ?                            ? ? ?Post Operative Evaluation ?  ? ?Procedure/Date of Surgery: Right tibial IMN 01/08/21 ? ?Interval History:  ? ?Presents today following the above procedure.  Overall she does continue to improve.  She is walking now with minimal assistive devices.  She has been working with physical therapy. ? ? ?PMH/PSH/Family History/Social History/Meds/Allergies:   ? ?Past Medical History:  ?Diagnosis Date  ? Anemia   ? Anxiety   ? Autoimmune disease ([place])   ? not an issue in years.  ? Family history of adverse reaction to anesthesia   ? History of hiatal hernia   ? Ulcerative colitis ([place])   ? 20's and 30's  ? ?Past Surgical History:  ?Procedure Laterality Date  ? CESAREAN SECTION    ? COLONOSCOPY W/ POLYPECTOMY    ? TIBIA IM NAIL INSERTION Right 01/08/2021  ? Procedure: INTRAMEDULLARY (IM) NAIL TIBIAL;  Surgeon: Vanetta Mulders, MD;  Location: [place];  Service: Orthopedics;  Laterality: Right;  ? WISDOM TOOTH EXTRACTION    ? ?Social History  ? ?Socioeconomic History  ? Marital status: Married  ?  Spouse name: Not on file  ? Number of children: Not on file  ? Years of education: Not on file  ? Highest education level: Not on file  ?Occupational History  ? Not on file  ?Tobacco Use  ? Smoking status: Never  ? Smokeless tobacco: Never  ?Vaping Use  ? Vaping Use: Never used  ?Substance and Sexual Activity  ? Alcohol use: Not Currently  ?  Comment: 2 wine- 2 times a month  ? Drug use: Yes  ?  Frequency: 7.0 times per week  ?  Types: Marijuana  ? Sexual activity: Not on file  ?Other Topics Concern  ? Not on file  ?Social History Narrative  ? Not on file  ? ?Social Determinants of Health  ? ?Financial Resource Strain: Not on file  ?Food Insecurity: Not on file  ?Transportation Needs: Not on file  ?Physical Activity: Not on file  ?Stress: Not on file  ?Social Connections: Not on file  ? ?No family history on file. ?Allergies  ?Allergen Reactions  ? Asa [Aspirin]   ? Codeine   ? Red Dye   ?  Tolerated  Acetaminophen inpatient 12/28  ? Tramadol Other (See Comments)  ? ?Current Outpatient Medications  ?Medication Sig Dispense Refill  ? busPIRone (BUSPAR) 15 MG tablet Take 5 mg by mouth at bedtime.    ? Cholecalciferol (VITAMIN D3 SUPER STRENGTH) 50 MCG (2000 UT) TABS Take 4,000 Units by mouth daily.    ? enoxaparin (LOVENOX) 40 MG/0.4ML injection Inject 0.4 mLs (40 mg total) into the skin daily for 14 days. 5.6 mL 0  ? estradiol (ESTRACE) 0.1 MG/GM vaginal cream Place 1 g vaginally at bedtime.    ? melatonin 3 MG TABS tablet Take 3 mg by mouth at bedtime.    ? oxyCODONE (OXY IR/ROXICODONE) 5 MG immediate release tablet Take 1 tablet (5 mg total) by mouth every 4 (four) hours as needed (severe pain). 30 tablet 0  ? oxycodone (OXY-IR) 5 MG capsule Take 1 capsule (5 mg total) by mouth every 4 (four) hours as needed (severe pain). 20 capsule 0  ? oxycodone (OXY-IR) 5 MG capsule Take 1 capsule (5 mg total) by mouth every 4 (four) hours as needed (severe  pain). 20 capsule 0  ? Oyster Shell (OYSTER CALCIUM) 500 MG TABS tablet Take 500 mg of elemental calcium by mouth daily.    ? pantoprazole (PROTONIX) 20 MG tablet Take 1 tablet (20 mg total) by mouth daily. (Patient not taking: Reported on 01/06/2021) 30 tablet 0  ? ?No current facility-administered medications for this visit.  ? ?No results found. ? ?Review of Systems:   ?A ROS was performed including pertinent positives and negatives as documented in the HPI. ? ? ?Musculoskeletal Exam:   ? ?Last menstrual period 09/06/2011. ? ?There is good healing of her proximal and distal incisions.  No surrounding erythema or drainage.  There is expected bruising around the tibia.  Compartments are all soft.  She is able to dorsiflex at the right ankle without difficulty.  There is weakness at the EHL tendon.  Sensation is intactin the right lower extremity. ? ?Imaging:   ? ?Right tib-fib 2 views x-ray: ?Status post intramedullary nail fixation without evidence of hardware  complication.  There is interval healing ? ?I personally reviewed and interpreted the radiographs. ? ? ?Assessment:   ?51 year old female 69-monthstatus post intramedullary nail, overall continue to improve.  She is now walking with minimal requirement of gait assistive devices.  She does persistently have EHL weakness which I believe is a nerve injury related to the actual injury.  I have discussed that these tend to slowly progress over time.  She is experiencing tingling in the dorsal aspect of the great toe consistent with ongoing healing.  I would like her to continue to advance her gait.  And I will see her back in 2 months ?Plan :   ? ?-She will follow-up in 2.5 months ? ? ? ? ?I personally saw and evaluated the patient, and participated in the management and treatment plan. ? ?SVanetta Mulders MD ?Attending Physician, Orthopedic Surgery ? ?This document was dictated using [employer] analyst A reasonable attempt at proof reading has been made to minimize errors. ?

## 2021-06-03 ENCOUNTER — Ambulatory Visit: Payer: 59 | Admitting: Dermatology

## 2021-06-03 ENCOUNTER — Encounter: Payer: Self-pay | Admitting: Dermatology

## 2021-06-03 DIAGNOSIS — Z1283 Encounter for screening for malignant neoplasm of skin: Secondary | ICD-10-CM | POA: Diagnosis not present

## 2021-06-03 DIAGNOSIS — D1800 Hemangioma unspecified site: Secondary | ICD-10-CM

## 2021-06-28 ENCOUNTER — Encounter: Payer: Self-pay | Admitting: Dermatology

## 2021-06-28 NOTE — Progress Notes (Signed)
   Follow-Up Visit   Subjective  Carly Armstrong is a 51 y.o. female who presents for the following: Annual Exam (No new concerns).  Annual skin examination Location:  Duration:  Quality:  Associated Signs/Symptoms: Modifying Factors:  Severity:  Timing: Context:   Objective  Well appearing patient in no apparent distress; mood and affect are within normal limits. Full body skin examination: No atypical pigmented lesions (all checked with dermoscopy) or nonmelanoma skin cancer  Multiple 1 to 2 mm smooth red dermal papules    A full examination was performed including scalp, head, eyes, ears, nose, lips, neck, chest, axillae, abdomen, back, buttocks, bilateral upper extremities, bilateral lower extremities, hands, feet, fingers, toes, fingernails, and toenails. All findings within normal limits unless otherwise noted below.  Areas beneath undergarments not fully examined   Assessment & Plan    Encounter for screening for malignant neoplasm of skin  Annual skin examination  Hemangioma, unspecified site  No intervention indicated      I, Lavonna Monarch, MD, have reviewed all documentation for this visit.  The documentation on 06/28/21 for the exam, diagnosis, procedures, and orders are all accurate and complete.

## 2021-07-03 ENCOUNTER — Ambulatory Visit (HOSPITAL_BASED_OUTPATIENT_CLINIC_OR_DEPARTMENT_OTHER): Payer: 59 | Admitting: Orthopaedic Surgery

## 2021-07-03 ENCOUNTER — Ambulatory Visit (INDEPENDENT_AMBULATORY_CARE_PROVIDER_SITE_OTHER): Payer: 59

## 2021-07-03 DIAGNOSIS — S82391D Other fracture of lower end of right tibia, subsequent encounter for closed fracture with routine healing: Secondary | ICD-10-CM

## 2021-09-24 ENCOUNTER — Ambulatory Visit (INDEPENDENT_AMBULATORY_CARE_PROVIDER_SITE_OTHER): Payer: Commercial Managed Care - HMO | Admitting: Orthopaedic Surgery

## 2021-09-24 DIAGNOSIS — S82391D Other fracture of lower end of right tibia, subsequent encounter for closed fracture with routine healing: Secondary | ICD-10-CM | POA: Diagnosis not present

## 2021-09-24 NOTE — Progress Notes (Signed)
Post Operative Evaluation    Procedure/Date of Surgery: Right tibial IMN 01/08/21  Interval History:   09/24/2021:   Presents today for follow-up of her right tibial nail.  She continues to improve dramatically.  Overall she does have some minor numbness around the anterior aspect of the ankle although her strength is improving.  She does have some soreness around the fracture site.  PMH/PSH/Family History/Social History/Meds/Allergies:    Past Medical History:  Diagnosis Date   Anemia    Anxiety    Autoimmune disease ([place])    not an issue in years.   Family history of adverse reaction to anesthesia    History of hiatal hernia    Ulcerative colitis ([place])    20's and 30's   Past Surgical History:  Procedure Laterality Date   CESAREAN SECTION     COLONOSCOPY W/ POLYPECTOMY     TIBIA IM NAIL INSERTION Right 01/08/2021   Procedure: INTRAMEDULLARY (IM) NAIL TIBIAL;  Surgeon: Vanetta Mulders, MD;  Location: [facility];  Service: Orthopedics;  Laterality: Right;   WISDOM TOOTH EXTRACTION     Social History   Socioeconomic History   Marital status: Married    Spouse name: Not on file   Number of children: Not on file   Years of education: Not on file   Highest education level: Not on file  Occupational History   Not on file  Tobacco Use   Smoking status: Never   Smokeless tobacco: Never  Vaping Use   Vaping Use: Never used  Substance and Sexual Activity   Alcohol use: Not Currently    Comment: 2 wine- 2 times a month   Drug use: Yes    Frequency: 7.0 times per week    Types: Marijuana   Sexual activity: Not on file  Other Topics Concern   Not on file  Social History Narrative   Not on file   Social Determinants of Health   Financial Resource Strain: Not on file  Food Insecurity: Not on file  Transportation Needs: Not on file  Physical Activity: Not on file  Stress: Not on file  Social Connections: Not on file   No family  history on file. Allergies  Allergen Reactions   Asa [Aspirin]    Codeine    Red Dye     Tolerated Acetaminophen inpatient 12/28   Tramadol Other (See Comments)   Current Outpatient Medications  Medication Sig Dispense Refill   busPIRone (BUSPAR) 15 MG tablet Take 5 mg by mouth at bedtime.     Cholecalciferol (VITAMIN D3 SUPER STRENGTH) 50 MCG (2000 UT) TABS Take 4,000 Units by mouth daily.     enoxaparin (LOVENOX) 40 MG/0.4ML injection Inject 0.4 mLs (40 mg total) into the skin daily for 14 days. 5.6 mL 0   estradiol (ESTRACE) 0.1 MG/GM vaginal cream Place 1 g vaginally at bedtime.     melatonin 3 MG TABS tablet Take 3 mg by mouth at bedtime.     oxyCODONE (OXY IR/ROXICODONE) 5 MG immediate release tablet Take 1 tablet (5 mg total) by mouth every 4 (four) hours as needed (severe pain). 30 tablet 0   oxycodone (OXY-IR) 5 MG capsule Take 1 capsule (5 mg total) by mouth every 4 (four) hours as needed (severe pain). 20 capsule 0   oxycodone (OXY-IR) 5  MG capsule Take 1 capsule (5 mg total) by mouth every 4 (four) hours as needed (severe pain). 20 capsule 0   Oyster Shell (OYSTER CALCIUM) 500 MG TABS tablet Take 500 mg of elemental calcium by mouth daily.     pantoprazole (PROTONIX) 20 MG tablet Take 1 tablet (20 mg total) by mouth daily. 30 tablet 0   No current facility-administered medications for this visit.   No results found.  Review of Systems:   A ROS was performed including pertinent positives and negatives as documented in the HPI.   Musculoskeletal Exam:    Last menstrual period 09/06/2011.  Incisions are healed.  No surrounding erythema or drainage.  Dorsiflexion foot is 30 degrees plantarflexion of 30.  She does have some weakness with EHL.  There is still some dorsal numbness in the DP distribution. Imaging:    Right tib-fib 2 views x-ray: Status post intramedullary nail fixation without evidence of hardware complication.  There is interval healing  I personally  reviewed and interpreted the radiographs.   Assessment:   51 year old female 9 months status post left tibial intramedullary nailing overall doing well.  At this time she will follow-up as needed Plan :    -She will follow-up as needed     I personally saw and evaluated the patient, and participated in the management and treatment plan.  Vanetta Mulders, MD Attending Physician, Orthopedic Surgery  This document was dictated using Dragon voice recognition software. A reasonable attempt at proof reading has been made to minimize errors.

## 2022-06-09 ENCOUNTER — Ambulatory Visit: Payer: Self-pay | Admitting: Dermatology

## 2022-06-26 DIAGNOSIS — M8580 Other specified disorders of bone density and structure, unspecified site: Secondary | ICD-10-CM | POA: Insufficient documentation

## 2022-09-11 DIAGNOSIS — S42412B Displaced simple supracondylar fracture without intercondylar fracture of left humerus, initial encounter for open fracture: Secondary | ICD-10-CM | POA: Insufficient documentation

## 2022-09-11 DIAGNOSIS — S52502B Unspecified fracture of the lower end of left radius, initial encounter for open fracture type I or II: Secondary | ICD-10-CM | POA: Insufficient documentation

## 2022-09-12 DIAGNOSIS — S3722XA Contusion of bladder, initial encounter: Secondary | ICD-10-CM | POA: Insufficient documentation

## 2022-09-12 DIAGNOSIS — S42309A Unspecified fracture of shaft of humerus, unspecified arm, initial encounter for closed fracture: Secondary | ICD-10-CM | POA: Insufficient documentation

## 2022-09-12 DIAGNOSIS — S5290XA Unspecified fracture of unspecified forearm, initial encounter for closed fracture: Secondary | ICD-10-CM | POA: Insufficient documentation

## 2022-09-12 DIAGNOSIS — S329XXA Fracture of unspecified parts of lumbosacral spine and pelvis, initial encounter for closed fracture: Secondary | ICD-10-CM | POA: Insufficient documentation

## 2022-09-12 DIAGNOSIS — S32059A Unspecified fracture of fifth lumbar vertebra, initial encounter for closed fracture: Secondary | ICD-10-CM | POA: Insufficient documentation

## 2022-10-08 DIAGNOSIS — Z87828 Personal history of other (healed) physical injury and trauma: Secondary | ICD-10-CM | POA: Insufficient documentation

## 2022-10-21 ENCOUNTER — Other Ambulatory Visit (HOSPITAL_BASED_OUTPATIENT_CLINIC_OR_DEPARTMENT_OTHER): Payer: Self-pay | Admitting: Orthopaedic Surgery

## 2022-10-21 ENCOUNTER — Ambulatory Visit (INDEPENDENT_AMBULATORY_CARE_PROVIDER_SITE_OTHER): Payer: Managed Care, Other (non HMO)

## 2022-10-21 ENCOUNTER — Encounter (HOSPITAL_BASED_OUTPATIENT_CLINIC_OR_DEPARTMENT_OTHER): Payer: Self-pay | Admitting: Orthopaedic Surgery

## 2022-10-21 ENCOUNTER — Ambulatory Visit (HOSPITAL_BASED_OUTPATIENT_CLINIC_OR_DEPARTMENT_OTHER): Payer: Commercial Managed Care - HMO | Admitting: Orthopaedic Surgery

## 2022-10-21 DIAGNOSIS — T07XXXA Unspecified multiple injuries, initial encounter: Secondary | ICD-10-CM | POA: Diagnosis not present

## 2022-10-21 MED ORDER — GABAPENTIN 100 MG PO CAPS: 100.0000 mg | ORAL_CAPSULE | Freq: Three times a day (TID) | ORAL | 2 refills | Status: AC

## 2022-10-21 MED ORDER — CYCLOBENZAPRINE HCL 10 MG PO TABS: 10.0000 mg | ORAL_TABLET | Freq: Three times a day (TID) | ORAL | 0 refills | Status: DC | PRN

## 2022-10-21 MED ORDER — CELECOXIB 100 MG PO CAPS: 100.0000 mg | ORAL_CAPSULE | Freq: Two times a day (BID) | ORAL | 3 refills | Status: DC

## 2022-10-21 MED ORDER — OXYCODONE HCL 5 MG PO TABS
5.0000 mg | ORAL_TABLET | ORAL | 0 refills | Status: DC | PRN
Start: 2022-10-21 — End: 2023-07-20
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
## 2022-10-22 ENCOUNTER — Other Ambulatory Visit: Payer: Managed Care, Other (non HMO)

## 2022-10-28 ENCOUNTER — Ambulatory Visit (HOSPITAL_BASED_OUTPATIENT_CLINIC_OR_DEPARTMENT_OTHER): Payer: Commercial Managed Care - HMO | Admitting: Orthopaedic Surgery

## 2022-11-06 ENCOUNTER — Ambulatory Visit
Admission: RE | Admit: 2022-11-06 | Discharge: 2022-11-06 | Disposition: A | Discharge status: Home / Self Care | Payer: Managed Care, Other (non HMO) | Source: Ambulatory Visit | Attending: Orthopaedic Surgery | Admitting: Orthopaedic Surgery

## 2022-11-06 DIAGNOSIS — T07XXXA Unspecified multiple injuries, initial encounter: Secondary | ICD-10-CM

## 2022-11-10 ENCOUNTER — Ambulatory Visit (HOSPITAL_BASED_OUTPATIENT_CLINIC_OR_DEPARTMENT_OTHER): Payer: Managed Care, Other (non HMO) | Attending: Orthopaedic Surgery | Admitting: Physical Therapy

## 2022-11-10 ENCOUNTER — Other Ambulatory Visit: Payer: Self-pay

## 2022-11-10 ENCOUNTER — Encounter (HOSPITAL_BASED_OUTPATIENT_CLINIC_OR_DEPARTMENT_OTHER): Payer: Self-pay | Admitting: Physical Therapy

## 2022-11-10 DIAGNOSIS — M79604 Pain in right leg: Secondary | ICD-10-CM | POA: Diagnosis present

## 2022-11-10 DIAGNOSIS — R262 Difficulty in walking, not elsewhere classified: Secondary | ICD-10-CM | POA: Insufficient documentation

## 2022-11-10 DIAGNOSIS — M6281 Muscle weakness (generalized): Secondary | ICD-10-CM | POA: Insufficient documentation

## 2022-11-10 DIAGNOSIS — M79602 Pain in left arm: Secondary | ICD-10-CM | POA: Diagnosis present

## 2022-11-10 DIAGNOSIS — T07XXXA Unspecified multiple injuries, initial encounter: Secondary | ICD-10-CM | POA: Diagnosis not present
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
## 2022-11-11 ENCOUNTER — Ambulatory Visit (HOSPITAL_BASED_OUTPATIENT_CLINIC_OR_DEPARTMENT_OTHER): Payer: Self-pay | Admitting: Physical Therapy

## 2022-11-11 ENCOUNTER — Other Ambulatory Visit: Payer: Managed Care, Other (non HMO)

## 2022-11-13 ENCOUNTER — Encounter (HOSPITAL_BASED_OUTPATIENT_CLINIC_OR_DEPARTMENT_OTHER): Payer: Self-pay | Admitting: Physical Therapy

## 2022-11-13 ENCOUNTER — Ambulatory Visit (HOSPITAL_BASED_OUTPATIENT_CLINIC_OR_DEPARTMENT_OTHER): Payer: Commercial Managed Care - HMO | Attending: Orthopaedic Surgery | Admitting: Physical Therapy

## 2022-11-13 DIAGNOSIS — M79661 Pain in right lower leg: Secondary | ICD-10-CM | POA: Diagnosis present

## 2022-11-13 DIAGNOSIS — M79602 Pain in left arm: Secondary | ICD-10-CM | POA: Insufficient documentation

## 2022-11-13 DIAGNOSIS — R262 Difficulty in walking, not elsewhere classified: Secondary | ICD-10-CM | POA: Insufficient documentation

## 2022-11-13 DIAGNOSIS — M79604 Pain in right leg: Secondary | ICD-10-CM | POA: Diagnosis present

## 2022-11-13 DIAGNOSIS — M6281 Muscle weakness (generalized): Secondary | ICD-10-CM | POA: Diagnosis present
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
## 2022-11-18 ENCOUNTER — Ambulatory Visit (HOSPITAL_BASED_OUTPATIENT_CLINIC_OR_DEPARTMENT_OTHER): Payer: Managed Care, Other (non HMO) | Admitting: Orthopaedic Surgery

## 2022-11-18 DIAGNOSIS — M2391 Unspecified internal derangement of right knee: Secondary | ICD-10-CM
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
## 2022-11-19 ENCOUNTER — Encounter (HOSPITAL_BASED_OUTPATIENT_CLINIC_OR_DEPARTMENT_OTHER): Payer: Self-pay | Admitting: Physical Therapy

## 2022-11-19 ENCOUNTER — Ambulatory Visit (HOSPITAL_BASED_OUTPATIENT_CLINIC_OR_DEPARTMENT_OTHER): Payer: Commercial Managed Care - HMO | Admitting: Physical Therapy

## 2022-11-19 ENCOUNTER — Ambulatory Visit: Payer: Managed Care, Other (non HMO) | Admitting: Family Medicine

## 2022-11-19 ENCOUNTER — Other Ambulatory Visit: Payer: Self-pay

## 2022-11-19 VITALS — BP 118/68 | HR 80 | Ht 66.0 in

## 2022-11-19 DIAGNOSIS — M6281 Muscle weakness (generalized): Secondary | ICD-10-CM

## 2022-11-19 DIAGNOSIS — M79604 Pain in right leg: Secondary | ICD-10-CM

## 2022-11-19 DIAGNOSIS — M79651 Pain in right thigh: Secondary | ICD-10-CM | POA: Diagnosis not present

## 2022-11-19 DIAGNOSIS — R262 Difficulty in walking, not elsewhere classified: Secondary | ICD-10-CM
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
## 2022-11-19 NOTE — Patient Instructions (Addendum)
Thank you for coming in today.   You received an injection today. Seek immediate medical attention if the joint becomes red, extremely painful, or is oozing fluid.   I recommend you obtained a compression sleeve to help with your joint problems. There are many options on the market however I recommend obtaining a Full Thigh Body Helix compression sleeve.  You can find information (including how to appropriate measure yourself for sizing) can be found at [email or web address].  Many of these products are health savings account (HSA) eligible.  You can use the compression sleeve at any time throughout the day but is most important to use while being active as well as for 2 hours post-activity.   It is appropriate to ice following activity with the compression sleeve in place.   Let me know how this goes. I am happy to be here for you as you go.

## 2022-11-20 NOTE — Progress Notes (Signed)
Fluid aspirated from the leg does not show any signs of infection.  Culture is pending.

## 2022-11-23 ENCOUNTER — Other Ambulatory Visit: Payer: Self-pay | Admitting: Orthopedic Surgery

## 2022-11-23 ENCOUNTER — Other Ambulatory Visit (INDEPENDENT_AMBULATORY_CARE_PROVIDER_SITE_OTHER): Payer: Commercial Managed Care - HMO

## 2022-11-23 ENCOUNTER — Ambulatory Visit: Payer: Commercial Managed Care - HMO | Admitting: Orthopedic Surgery

## 2022-11-23 DIAGNOSIS — S52502K Unspecified fracture of the lower end of left radius, subsequent encounter for closed fracture with nonunion: Secondary | ICD-10-CM

## 2022-11-23 DIAGNOSIS — M25532 Pain in left wrist: Secondary | ICD-10-CM | POA: Diagnosis not present

## 2022-11-23 DIAGNOSIS — M2580 Other specified joint disorders, unspecified joint: Secondary | ICD-10-CM

## 2022-11-23 DIAGNOSIS — S52602K Unspecified fracture of lower end of left ulna, subsequent encounter for closed fracture with nonunion: Secondary | ICD-10-CM
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
## 2022-11-23 NOTE — Progress Notes (Signed)
No bacteria is growing from the culture.  We can expect another day or 2 before it's final.

## 2022-11-24 ENCOUNTER — Encounter (HOSPITAL_BASED_OUTPATIENT_CLINIC_OR_DEPARTMENT_OTHER): Payer: Self-pay | Admitting: Physical Therapy

## 2022-11-24 ENCOUNTER — Ambulatory Visit (HOSPITAL_BASED_OUTPATIENT_CLINIC_OR_DEPARTMENT_OTHER): Payer: Commercial Managed Care - HMO | Admitting: Physical Therapy

## 2022-11-24 ENCOUNTER — Other Ambulatory Visit (HOSPITAL_BASED_OUTPATIENT_CLINIC_OR_DEPARTMENT_OTHER): Payer: Self-pay | Admitting: Orthopaedic Surgery

## 2022-11-24 DIAGNOSIS — M25532 Pain in left wrist: Secondary | ICD-10-CM

## 2022-11-24 DIAGNOSIS — M6281 Muscle weakness (generalized): Secondary | ICD-10-CM

## 2022-11-24 DIAGNOSIS — R262 Difficulty in walking, not elsewhere classified: Secondary | ICD-10-CM

## 2022-11-24 DIAGNOSIS — M79604 Pain in right leg: Secondary | ICD-10-CM

## 2022-11-24 DIAGNOSIS — M79661 Pain in right lower leg: Secondary | ICD-10-CM

## 2022-11-24 DIAGNOSIS — M79602 Pain in left arm: Secondary | ICD-10-CM

## 2022-11-24 LAB — ALKALINE PHOSPHATASE: Alkaline phosphatase (APISO): 149 U/L (ref 37–153)

## 2022-11-24 LAB — SEDIMENTATION RATE: Sed Rate: 28 mm/h (ref 0–30)

## 2022-11-24 LAB — C-REACTIVE PROTEIN: CRP: <3 mg/L (ref <8.0)

## 2022-11-24 LAB — CK: Total CK: 27 U/L — ABNORMAL LOW (ref 29–143)
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
## 2022-11-25 LAB — SYNOVIAL FLUID ANALYSIS, COMPLETE
Basophils, %: 0 %
Eosinophils-Synovial: 1 % (ref 0–2)
Lymphocytes-Synovial Fld: 43 % (ref 0–74)
Monocyte/Macrophage: 49 % (ref 0–69)
Neutrophil, Synovial: 7 % (ref 0–24)
Synoviocytes, %: 0 % (ref 0–15)
WBC, Synovial: 797 {cells}/uL — ABNORMAL HIGH (ref <150)

## 2022-11-25 LAB — ANAEROBIC AND AEROBIC CULTURE
AER RESULT:: NO GROWTH
MICRO NUMBER:: 15700087
MICRO NUMBER:: 15700088
SPECIMEN QUALITY:: ADEQUATE
SPECIMEN QUALITY:: ADEQUATE

## 2022-11-25 NOTE — Progress Notes (Signed)
Culture resulted as negative.  No bacterial growth.

## 2022-11-26 ENCOUNTER — Encounter (HOSPITAL_BASED_OUTPATIENT_CLINIC_OR_DEPARTMENT_OTHER): Payer: Self-pay | Admitting: Physical Therapy

## 2022-11-26 ENCOUNTER — Ambulatory Visit (HOSPITAL_BASED_OUTPATIENT_CLINIC_OR_DEPARTMENT_OTHER): Payer: Commercial Managed Care - HMO | Admitting: Physical Therapy

## 2022-11-26 DIAGNOSIS — M6281 Muscle weakness (generalized): Secondary | ICD-10-CM

## 2022-11-26 DIAGNOSIS — M79604 Pain in right leg: Secondary | ICD-10-CM | POA: Diagnosis not present

## 2022-11-26 DIAGNOSIS — R262 Difficulty in walking, not elsewhere classified: Secondary | ICD-10-CM
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
## 2022-12-01 ENCOUNTER — Ambulatory Visit (HOSPITAL_BASED_OUTPATIENT_CLINIC_OR_DEPARTMENT_OTHER): Payer: Managed Care, Other (non HMO) | Admitting: Physical Therapy

## 2022-12-03 ENCOUNTER — Ambulatory Visit (HOSPITAL_BASED_OUTPATIENT_CLINIC_OR_DEPARTMENT_OTHER): Payer: Managed Care, Other (non HMO) | Admitting: Physical Therapy

## 2022-12-08 ENCOUNTER — Ambulatory Visit
Admission: RE | Admit: 2022-12-08 | Discharge: 2022-12-08 | Disposition: A | Discharge status: Home / Self Care | Payer: Managed Care, Other (non HMO) | Source: Ambulatory Visit | Attending: Orthopaedic Surgery | Admitting: Orthopaedic Surgery

## 2022-12-08 ENCOUNTER — Ambulatory Visit (HOSPITAL_BASED_OUTPATIENT_CLINIC_OR_DEPARTMENT_OTHER): Payer: Managed Care, Other (non HMO) | Admitting: Physical Therapy

## 2022-12-08 ENCOUNTER — Encounter: Payer: Self-pay | Admitting: Family Medicine

## 2022-12-08 DIAGNOSIS — M2391 Unspecified internal derangement of right knee: Secondary | ICD-10-CM

## 2022-12-12 ENCOUNTER — Encounter (HOSPITAL_BASED_OUTPATIENT_CLINIC_OR_DEPARTMENT_OTHER): Payer: Managed Care, Other (non HMO) | Admitting: Physical Therapy

## 2022-12-14 ENCOUNTER — Ambulatory Visit (HOSPITAL_BASED_OUTPATIENT_CLINIC_OR_DEPARTMENT_OTHER): Payer: Managed Care, Other (non HMO) | Admitting: Physical Therapy

## 2022-12-18 ENCOUNTER — Ambulatory Visit (HOSPITAL_BASED_OUTPATIENT_CLINIC_OR_DEPARTMENT_OTHER): Payer: Managed Care, Other (non HMO) | Admitting: Orthopaedic Surgery

## 2022-12-21 ENCOUNTER — Encounter (HOSPITAL_BASED_OUTPATIENT_CLINIC_OR_DEPARTMENT_OTHER): Payer: Self-pay | Admitting: Orthopaedic Surgery

## 2022-12-21 IMAGING — DX DG TIBIA/FIBULA 2V*R*
2 series · 2 of 2 positions shown · non-contrast
Comparison: Radiograph 01/23/2021

CLINICAL DATA: Postoperative IM nail right distal tibial fracture.

EXAM:
RIGHT TIBIA AND FIBULA - 2 VIEW

[tibia ap]
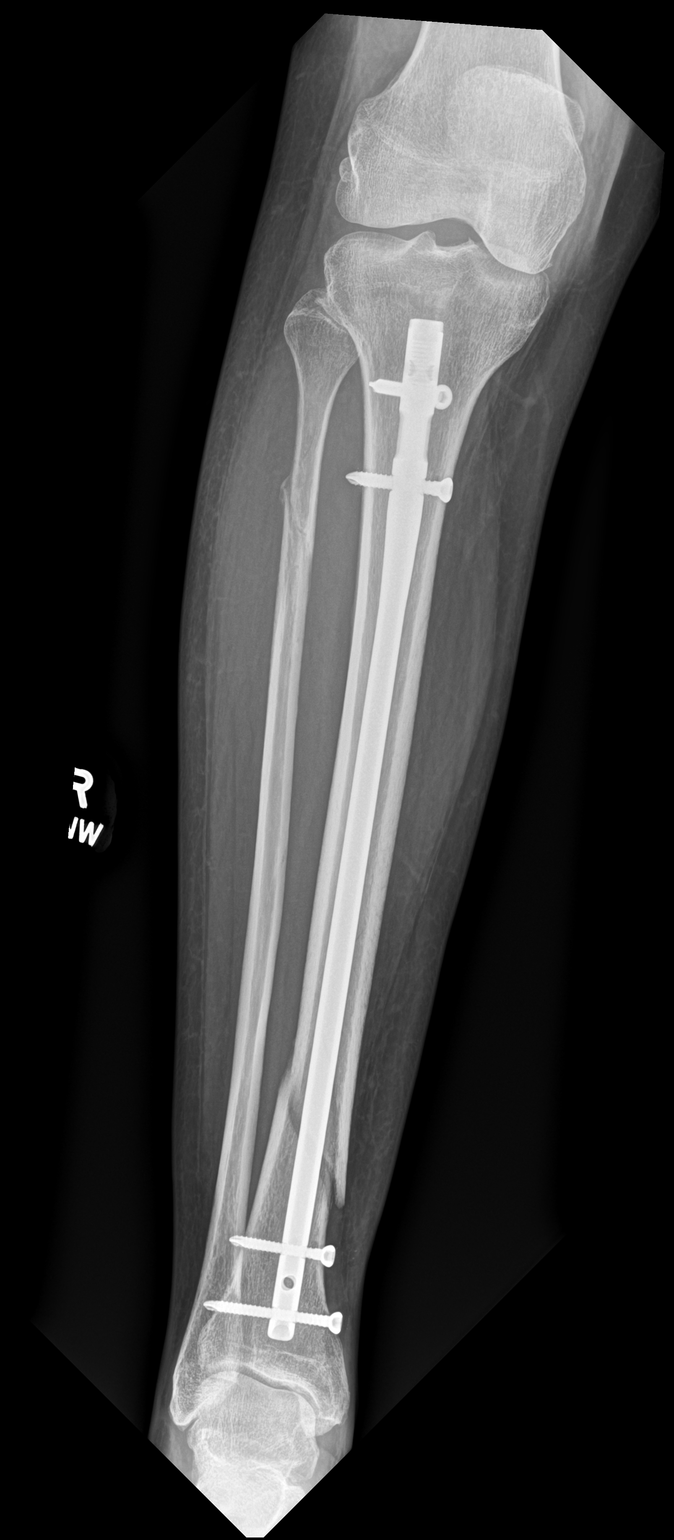

[tibia lat]
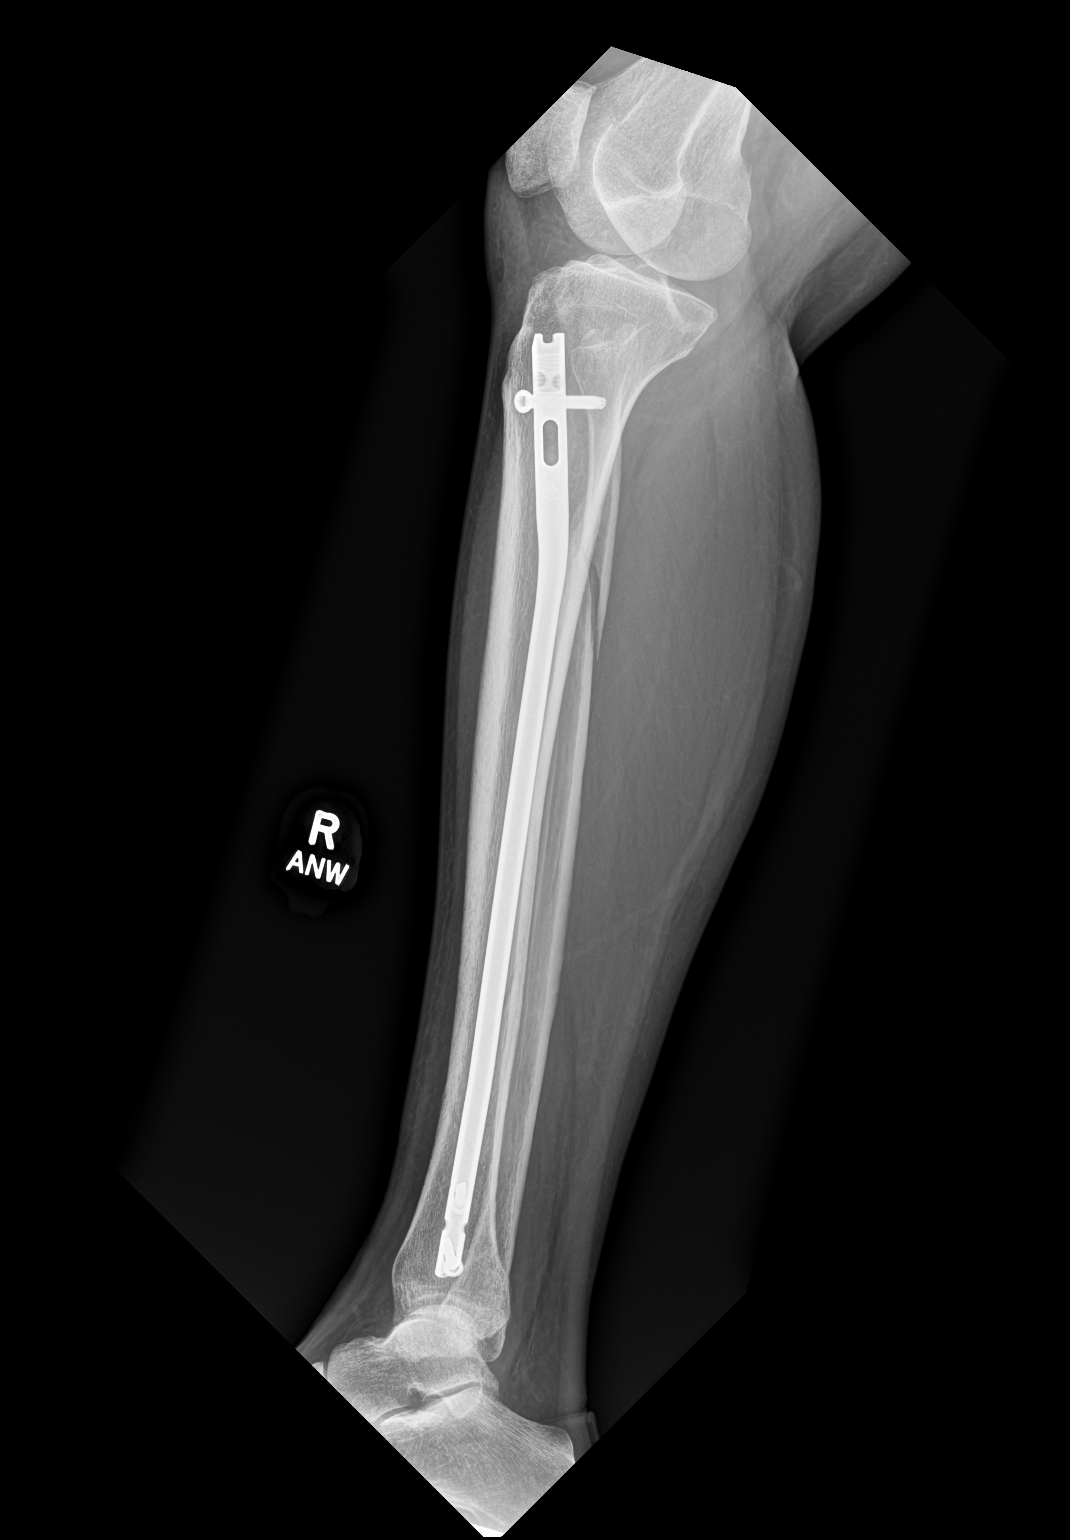

[2 of 2 positions shown; findings below may reference images not displayed]

FINDINGS: Intramedullary nail with proximal and distal locking screws traverse
distal tibial shaft fracture. Unchanged fracture alignment from
prior exam. Some peripheral callus formation about the lateral
aspect of the fracture line. Proximal fibular shaft fracture is in
unchanged alignment, with some peripheral bony callus formation.
Knee and ankle alignment are maintained.
IMPRESSION: Healing distal tibial and proximal fibular fractures. Unchanged
alignment.

## 2022-12-25 ENCOUNTER — Ambulatory Visit (HOSPITAL_BASED_OUTPATIENT_CLINIC_OR_DEPARTMENT_OTHER): Payer: Commercial Managed Care - HMO | Admitting: Orthopaedic Surgery

## 2022-12-25 DIAGNOSIS — M2391 Unspecified internal derangement of right knee: Secondary | ICD-10-CM | POA: Diagnosis not present
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
## 2023-01-05 DIAGNOSIS — S62102A Fracture of unspecified carpal bone, left wrist, initial encounter for closed fracture: Secondary | ICD-10-CM | POA: Insufficient documentation

## 2023-01-08 ENCOUNTER — Ambulatory Visit: Payer: Self-pay | Admitting: Podiatry

## 2023-01-14 ENCOUNTER — Encounter: Payer: Self-pay | Admitting: Podiatry

## 2023-01-14 ENCOUNTER — Ambulatory Visit (INDEPENDENT_AMBULATORY_CARE_PROVIDER_SITE_OTHER): Payer: Self-pay | Admitting: Podiatry

## 2023-01-14 DIAGNOSIS — B351 Tinea unguium: Secondary | ICD-10-CM | POA: Diagnosis not present

## 2023-01-14 MED ORDER — EFINACONAZOLE 10 % EX SOLN: 1.0000 [drp] | Freq: Every day | CUTANEOUS | 11 refills | Status: DC
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
## 2023-02-22 ENCOUNTER — Ambulatory Visit (INDEPENDENT_AMBULATORY_CARE_PROVIDER_SITE_OTHER): Payer: No Typology Code available for payment source | Admitting: Podiatrist

## 2023-02-22 DIAGNOSIS — B351 Tinea unguium: Secondary | ICD-10-CM

## 2023-02-22 NOTE — Progress Notes (Signed)
 Patient presents today for the laser treatment # 2. Diagnosed with mycotic nail infection by Dr. Marlena Sima most affected are left 1,2,3 and right 1,2   See photo below for reference/ pre treatment   All other systems are negative.  Patients nails were filed thin using a sterile burr.    Laser therapy via [facility] Laser therapy system was admininstered to affected toenails bilateral .  The Patient tolerated the treatment well. All safety precautions were in place.   Single laser pass was done on non-affected nails.   Follow up in 4 weeks for laser # 3

## 2023-02-25 ENCOUNTER — Ambulatory Visit (HOSPITAL_BASED_OUTPATIENT_CLINIC_OR_DEPARTMENT_OTHER): Payer: Commercial Managed Care - HMO | Admitting: Orthopaedic Surgery

## 2023-03-12 ENCOUNTER — Other Ambulatory Visit: Payer: Commercial Managed Care - HMO

## 2023-03-18 ENCOUNTER — Ambulatory Visit (HOSPITAL_BASED_OUTPATIENT_CLINIC_OR_DEPARTMENT_OTHER): Payer: Self-pay | Admitting: Orthopaedic Surgery

## 2023-04-02 ENCOUNTER — Ambulatory Visit (INDEPENDENT_AMBULATORY_CARE_PROVIDER_SITE_OTHER): Payer: Commercial Managed Care - HMO

## 2023-04-02 DIAGNOSIS — B351 Tinea unguium: Secondary | ICD-10-CM

## 2023-04-02 NOTE — Progress Notes (Signed)
 Patient presents today for the 3rd laser treatment. Diagnosed with mycotic nail infection by Dr. Ardelle Anton.   Toenail most affected left 1-3 and right 1 & 2.  All other systems are negative.Notices minor improvement with bilateral 1st toenails.  Nails were filed thin. Laser therapy was administered to left 1-3 and right 1 & 2 and patient tolerated the treatment well. All safety precautions were in place.   Single laser pass was done on non-affected nails.   Follow up in 6 weeks for laser # 4.

## 2023-04-16 ENCOUNTER — Encounter (HOSPITAL_BASED_OUTPATIENT_CLINIC_OR_DEPARTMENT_OTHER): Payer: Self-pay | Admitting: Orthopaedic Surgery

## 2023-04-16 ENCOUNTER — Other Ambulatory Visit (HOSPITAL_BASED_OUTPATIENT_CLINIC_OR_DEPARTMENT_OTHER): Payer: Self-pay | Admitting: Orthopaedic Surgery

## 2023-04-16 DIAGNOSIS — T07XXXA Unspecified multiple injuries, initial encounter: Secondary | ICD-10-CM

## 2023-04-30 ENCOUNTER — Other Ambulatory Visit: Payer: No Typology Code available for payment source

## 2023-05-03 IMAGING — DX DG TIBIA/FIBULA 2V*R*
2 series · 2 of 2 positions shown · non-contrast
Comparison: 04/17/2021.

CLINICAL DATA: Postop follow-up.

EXAM:
RIGHT TIBIA AND FIBULA - 2 VIEW

[tibia ap]
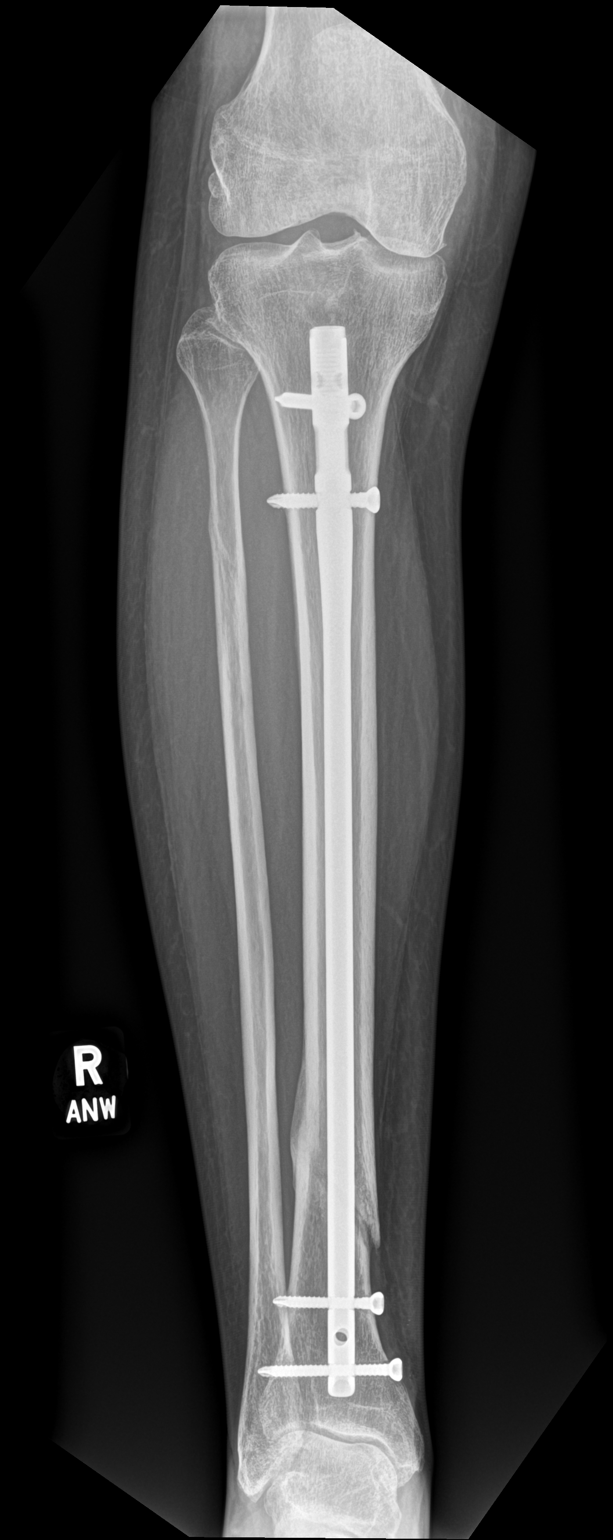

[tibia lat]
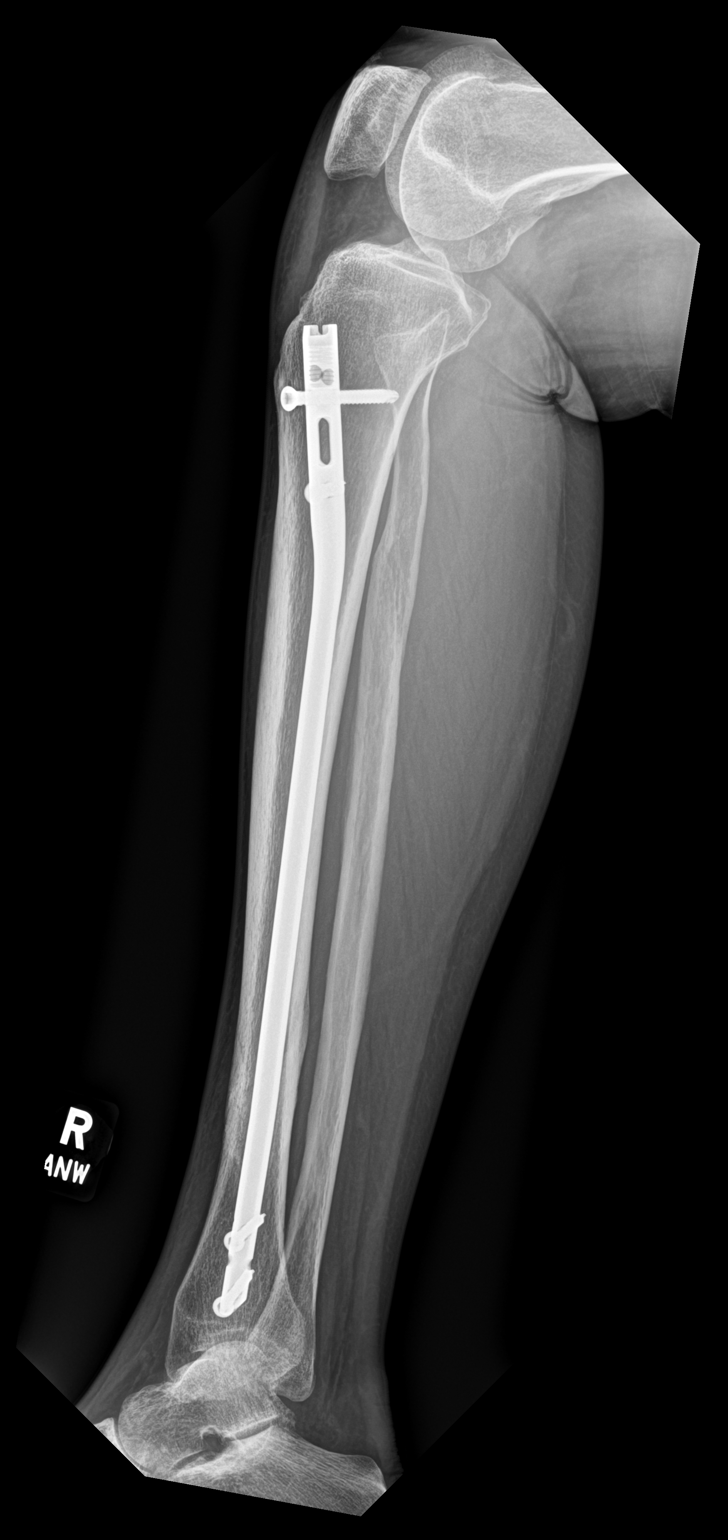

[2 of 2 positions shown; findings below may reference images not displayed]

FINDINGS: There is redemonstration of a fracture of the distal tibia with
intramedullary rod and screws. Fracture fragment alignment is
stable. Mildly increased bone callus formation is noted along the
lateral aspect of the tibia. There is bony deformity of the proximal
fibula compatible with old healed fracture. No evidence of hardware
loosening. The soft tissues are unremarkable.
IMPRESSION: 1. Progression of healing of distal tibia fracture with stable
alignment and no evidence of hardware loosening.
2. Healed fracture of the proximal fibula.

## 2023-05-07 ENCOUNTER — Ambulatory Visit (INDEPENDENT_AMBULATORY_CARE_PROVIDER_SITE_OTHER): Admitting: *Deleted

## 2023-05-07 DIAGNOSIS — B351 Tinea unguium: Secondary | ICD-10-CM

## 2023-05-07 NOTE — Progress Notes (Signed)
 Patient presents today for the 4th laser treatment. Diagnosed with mycotic nail infection by Dr. Clydia Dart.   Toenail most affected left 1-3 and right 1 & 2. Not much change in the nails as of yet.  All other systems are negative.Notices minor improvement with bilateral 1st toenails.  Nails were filed thin. Laser therapy was administered to left 1-3 and right 1 & 2 and patient tolerated the treatment well. All safety precautions were in place.    Follow up in 6 weeks for laser # 5

## 2023-05-13 ENCOUNTER — Encounter: Payer: Self-pay | Admitting: Family Medicine

## 2023-05-13 ENCOUNTER — Ambulatory Visit (INDEPENDENT_AMBULATORY_CARE_PROVIDER_SITE_OTHER): Payer: Self-pay | Admitting: Family Medicine

## 2023-05-13 ENCOUNTER — Other Ambulatory Visit: Payer: Self-pay

## 2023-05-13 VITALS — BP 118/80 | HR 80 | Ht 66.0 in | Wt 126.0 lb

## 2023-05-13 DIAGNOSIS — M79651 Pain in right thigh: Secondary | ICD-10-CM

## 2023-05-13 NOTE — Patient Instructions (Signed)
 Let me know if you would like referral to interventional radiology.
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
## 2023-05-21 ENCOUNTER — Other Ambulatory Visit
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
## 2023-06-25 ENCOUNTER — Ambulatory Visit (INDEPENDENT_AMBULATORY_CARE_PROVIDER_SITE_OTHER): Admitting: *Deleted

## 2023-06-25 DIAGNOSIS — B351 Tinea unguium: Secondary | ICD-10-CM

## 2023-06-25 NOTE — Progress Notes (Signed)
 Patient presents today for the 5th laser treatment. Diagnosed with mycotic nail infection by Dr. Clydia Dart.   Toenail most affected left 1-3 and right 1 & 2. She still doesn't have much change in the nails yet.  All other systems are negative.Notices minor improvement with bilateral 1st toenails.  Nails were filed thin. Laser therapy was administered to left 1-3 and right 1 & 2 and patient tolerated the treatment well. All safety precautions were in place.    Follow up in 8 weeks for laser # 6

## 2023-07-04 NOTE — Progress Notes (Signed)
 This telephone encounter was conducted with the patient's (or proxy's) verbal consent via secure, interactive audio telecommunications while in clinic/office/hospital.  The patient (or proxy) was instructed to have this encounter in a suitably private space and to only have persons present to whom they give permission to participate. In addition, the patient identity was confirmed by use of name plus an additional identifier.  I spent 15 minutes with patient and/or family on this phone visit today.  We discussed her gradual improvements in wrist motion in both supination pronation as well as flexion and extension. These are slow but gradual. Her nonunion reconstruction through shared imaging virtually is demonstrating improvement an interval osseous formation. We have isntruted her to continue to advance her therapy in order to maximize her range of motion as we may be able to get more aggressive given the improvements she is demonstrating in her bone healing.  Will plan to review repeat x-rays in 3 months.

## 2023-07-20 ENCOUNTER — Ambulatory Visit (INDEPENDENT_AMBULATORY_CARE_PROVIDER_SITE_OTHER): Payer: Self-pay | Admitting: Plastic Surgery

## 2023-07-20 ENCOUNTER — Encounter: Payer: Self-pay | Admitting: Plastic Surgery

## 2023-07-20 VITALS — BP 113/62 | HR 70 | Ht 66.5 in | Wt 138.6 lb

## 2023-07-20 DIAGNOSIS — L905 Scar conditions and fibrosis of skin: Secondary | ICD-10-CM | POA: Insufficient documentation
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
## 2023-07-21 NOTE — Telephone Encounter (Signed)
 See msg

## 2023-07-27 ENCOUNTER — Ambulatory Visit (INDEPENDENT_AMBULATORY_CARE_PROVIDER_SITE_OTHER): Payer: Self-pay | Admitting: Physician Assistant

## 2023-07-27 ENCOUNTER — Encounter: Payer: Self-pay | Admitting: Physician Assistant

## 2023-07-27 VITALS — BP 115/80 | HR 86 | Ht 67.0 in | Wt 137.6 lb

## 2023-07-27 DIAGNOSIS — L905 Scar conditions and fibrosis of skin: Secondary | ICD-10-CM

## 2023-07-27 MED ORDER — OXYCODONE HCL 5 MG PO TABS: 5.0000 mg | ORAL_TABLET | Freq: Three times a day (TID) | ORAL | 0 refills | Status: AC | PRN

## 2023-07-27 MED ORDER — CLINDAMYCIN HCL 150 MG PO CAPS: 150.0000 mg | ORAL_CAPSULE | Freq: Three times a day (TID) | ORAL | 0 refills | Status: AC
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
## 2023-08-02 DIAGNOSIS — Z411 Encounter for cosmetic surgery: Secondary | ICD-10-CM

## 2023-08-06 ENCOUNTER — Other Ambulatory Visit

## 2023-08-09 NOTE — Progress Notes (Deleted)
 Patient is a pleasant 53 year old female s/p release of scar contracture right thigh/hip with abdominal liposuction and fat grafting to the area of scarring performed 08/02/2023 by Dr. Lowery who presents to clinic for postoperative follow-up.

## 2023-08-10 ENCOUNTER — Encounter: Admitting: Physician Assistant

## 2023-08-11 ENCOUNTER — Ambulatory Visit (INDEPENDENT_AMBULATORY_CARE_PROVIDER_SITE_OTHER): Payer: Self-pay | Admitting: Surgical

## 2023-08-11 DIAGNOSIS — L905 Scar conditions and fibrosis of skin: Secondary | ICD-10-CM

## 2023-08-11 NOTE — Progress Notes (Signed)
 Patient is a very pleasant 53 year old female here for follow-up after scar contracture release of right hip and fat grafting to right hip.  Had liposuction of her abdomen.  She reports she is overall doing well, has some discomfort but otherwise is tolerating pain well and has no specific concerns.  She has been wearing TOPi foam over her right thigh and abdomen and her compression garment.  Chaperone present on exam On exam abdomen is soft, nontender, some mild ecchymosis is noted of the left abdomen.  Abdominal incision is intact. Right thigh with some ecchymosis and swelling present, no obvious subcutaneous fluid collections noted palpation.  There is no erythema or cellulitic changes noted.  A/P:  Recommend continuing with compressive garments and topi foam.  Mepilex border dressings on abdomen and thigh were changed.  She tolerated this well.  There is no signs of infection or concern on exam.  All of her questions were answered to her content.

## 2023-08-24 ENCOUNTER — Ambulatory Visit (INDEPENDENT_AMBULATORY_CARE_PROVIDER_SITE_OTHER): Payer: Self-pay | Admitting: Plastic Surgery

## 2023-08-24 ENCOUNTER — Encounter: Payer: Self-pay | Admitting: Plastic Surgery

## 2023-08-24 VITALS — BP 127/68 | HR 69 | Ht 66.5 in | Wt 140.4 lb

## 2023-08-24 DIAGNOSIS — L905 Scar conditions and fibrosis of skin: Secondary | ICD-10-CM

## 2023-08-24 NOTE — Progress Notes (Signed)
 The patient is a 53 year old female here for follow-up after undergoing scar release contracture release and fat grafting to the right hip.  She is very pleased with her results.  She has been putting some dressings over the incision sites.  I think this is causing a little bit of indentation.  We switched her to a long dressing.  No sign of infection.  Good improvement.  Continue with the lipo foam and compression for another 1 to 2 weeks.  Pictures were obtained of the patient and placed in the chart with the patient's or guardian's permission.

## 2023-09-08 ENCOUNTER — Encounter: Payer: Self-pay | Admitting: Plastic Surgery
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
## 2023-09-09 ENCOUNTER — Ambulatory Visit: Payer: Self-pay | Admitting: Physician Assistant

## 2023-09-09 ENCOUNTER — Encounter: Payer: Self-pay | Admitting: Physician Assistant

## 2023-09-09 VITALS — BP 107/63 | HR 77 | Ht 66.0 in | Wt 136.0 lb

## 2023-09-09 DIAGNOSIS — L905 Scar conditions and fibrosis of skin: Secondary | ICD-10-CM

## 2023-09-10 ENCOUNTER — Ambulatory Visit (INDEPENDENT_AMBULATORY_CARE_PROVIDER_SITE_OTHER)

## 2023-09-10 ENCOUNTER — Encounter: Admitting: Physician Assistant

## 2023-09-10 DIAGNOSIS — B351 Tinea unguium: Secondary | ICD-10-CM

## 2023-09-10 NOTE — Progress Notes (Signed)
 Patient presents today for the 6th laser treatment. Diagnosed with mycotic nail infection by Dr. Gershon.   Toenail most affected left 1-3 and right 1 & 2. Nails have started to show some decrease in yellowing and discoloration. The left 1st is still yellow and discolored more than the others.   All other systems are negative.  Nails were filed thin. Laser therapy was administered to left 1-3 and right 1 & 2 toenails and patient tolerated the treatment well. All safety precautions were in place. She is still using the Jublia . She is also using a dremmel file to thin her nails weekly. I did advise not to do this during laser treatment weeks as I am doing it as well. As far as continuing the filing at home after treatments I suggested speaking further with provider for best recommendations.    Follow up in 3 months with provider.

## 2023-10-01 ENCOUNTER — Ambulatory Visit (INDEPENDENT_AMBULATORY_CARE_PROVIDER_SITE_OTHER): Payer: Self-pay | Admitting: Plastic Surgery

## 2023-10-01 VITALS — BP 111/65 | HR 70

## 2023-10-01 DIAGNOSIS — L905 Scar conditions and fibrosis of skin: Secondary | ICD-10-CM

## 2023-10-01 NOTE — Progress Notes (Signed)
 The patient is a 53 year old female here for follow-up after undergoing liposuction and fat filling of her right leg.  Overall the hump has markedly improved and she is doing much better.  She would like to start doing some massaging of that area.  That should be absolutely fine.  Follow-up as needed  Pictures were obtained of the patient and placed in the chart with the patient's or guardian's permission.

## 2023-11-15 ENCOUNTER — Encounter: Payer: Self-pay | Admitting: Radiology

## 2023-12-13 ENCOUNTER — Ambulatory Visit: Payer: Self-pay | Admitting: Podiatry
